# Patient Record
Sex: Female | Born: 1960
Health system: Southern US, Community
[De-identification: ages and names within clinical notes are randomized; demographics above are authoritative.]

## PROBLEM LIST (undated history)

## (undated) DIAGNOSIS — K219 Gastro-esophageal reflux disease without esophagitis: Secondary | ICD-10-CM

## (undated) DIAGNOSIS — Z923 Personal history of irradiation: Secondary | ICD-10-CM

## (undated) DIAGNOSIS — Z91018 Allergy to other foods: Secondary | ICD-10-CM

## (undated) DIAGNOSIS — T7840XA Allergy, unspecified, initial encounter: Secondary | ICD-10-CM

## (undated) DIAGNOSIS — M479 Spondylosis, unspecified: Secondary | ICD-10-CM

## (undated) DIAGNOSIS — Z9889 Other specified postprocedural states: Secondary | ICD-10-CM

## (undated) DIAGNOSIS — E785 Hyperlipidemia, unspecified: Secondary | ICD-10-CM

## (undated) DIAGNOSIS — K760 Fatty (change of) liver, not elsewhere classified: Secondary | ICD-10-CM

## (undated) DIAGNOSIS — C50919 Malignant neoplasm of unspecified site of unspecified female breast: Secondary | ICD-10-CM

## (undated) DIAGNOSIS — T8859XA Other complications of anesthesia, initial encounter: Secondary | ICD-10-CM

## (undated) DIAGNOSIS — D165 Benign neoplasm of lower jaw bone: Secondary | ICD-10-CM

## (undated) DIAGNOSIS — I2510 Atherosclerotic heart disease of native coronary artery without angina pectoris: Secondary | ICD-10-CM

## (undated) DIAGNOSIS — R112 Nausea with vomiting, unspecified: Secondary | ICD-10-CM

## (undated) DIAGNOSIS — I10 Essential (primary) hypertension: Secondary | ICD-10-CM

## (undated) HISTORY — DX: Fatty (change of) liver, not elsewhere classified: K76.0

## (undated) HISTORY — PX: ADENOIDECTOMY: SUR15

## (undated) HISTORY — PX: TYMPANOSTOMY TUBE PLACEMENT: SHX32

## (undated) HISTORY — DX: Hyperlipidemia, unspecified: E78.5

## (undated) HISTORY — DX: Atherosclerotic heart disease of native coronary artery without angina pectoris: I25.10

## (undated) HISTORY — PX: OTHER SURGICAL HISTORY: SHX169

## (undated) HISTORY — DX: Gastro-esophageal reflux disease without esophagitis: K21.9

## (undated) HISTORY — DX: Allergy to other foods: Z91.018

## (undated) HISTORY — DX: Malignant neoplasm of unspecified site of unspecified female breast: C50.919

## (undated) HISTORY — PX: TONSILLECTOMY: SUR1361

## (undated) HISTORY — DX: Essential (primary) hypertension: I10

## (undated) HISTORY — DX: Allergy, unspecified, initial encounter: T78.40XA

## (undated) HISTORY — DX: Personal history of irradiation: Z92.3

## (undated) HISTORY — DX: Spondylosis, unspecified: M47.9

## (undated) HISTORY — PX: COLONOSCOPY: SHX174

---

## 2017-09-29 DIAGNOSIS — I10 Essential (primary) hypertension: Secondary | ICD-10-CM | POA: Diagnosis not present

## 2017-09-29 DIAGNOSIS — Z6834 Body mass index (BMI) 34.0-34.9, adult: Secondary | ICD-10-CM | POA: Diagnosis not present

## 2017-09-29 DIAGNOSIS — R5381 Other malaise: Secondary | ICD-10-CM | POA: Diagnosis not present

## 2017-09-29 DIAGNOSIS — E785 Hyperlipidemia, unspecified: Secondary | ICD-10-CM | POA: Diagnosis not present

## 2018-01-12 DIAGNOSIS — Z01419 Encounter for gynecological examination (general) (routine) without abnormal findings: Secondary | ICD-10-CM | POA: Diagnosis not present

## 2018-01-18 DIAGNOSIS — H524 Presbyopia: Secondary | ICD-10-CM | POA: Diagnosis not present

## 2018-10-21 DIAGNOSIS — E785 Hyperlipidemia, unspecified: Secondary | ICD-10-CM | POA: Diagnosis not present

## 2018-10-21 DIAGNOSIS — I10 Essential (primary) hypertension: Secondary | ICD-10-CM | POA: Diagnosis not present

## 2018-10-21 DIAGNOSIS — L812 Freckles: Secondary | ICD-10-CM | POA: Diagnosis not present

## 2018-10-21 DIAGNOSIS — Z1331 Encounter for screening for depression: Secondary | ICD-10-CM | POA: Diagnosis not present

## 2018-10-21 DIAGNOSIS — E349 Endocrine disorder, unspecified: Secondary | ICD-10-CM | POA: Diagnosis not present

## 2018-11-02 ENCOUNTER — Other Ambulatory Visit: Payer: Self-pay | Admitting: Internal Medicine

## 2018-11-02 DIAGNOSIS — Z1231 Encounter for screening mammogram for malignant neoplasm of breast: Secondary | ICD-10-CM

## 2018-11-03 ENCOUNTER — Other Ambulatory Visit: Payer: Self-pay | Admitting: Internal Medicine

## 2018-11-03 ENCOUNTER — Other Ambulatory Visit: Payer: Self-pay | Admitting: Radiology

## 2018-11-03 DIAGNOSIS — E785 Hyperlipidemia, unspecified: Secondary | ICD-10-CM

## 2018-11-03 DIAGNOSIS — Z8249 Family history of ischemic heart disease and other diseases of the circulatory system: Secondary | ICD-10-CM

## 2018-11-08 DIAGNOSIS — E7849 Other hyperlipidemia: Secondary | ICD-10-CM | POA: Diagnosis not present

## 2018-11-08 DIAGNOSIS — I10 Essential (primary) hypertension: Secondary | ICD-10-CM | POA: Diagnosis not present

## 2018-11-15 ENCOUNTER — Other Ambulatory Visit: Payer: Self-pay

## 2018-11-22 ENCOUNTER — Other Ambulatory Visit: Payer: Self-pay

## 2018-12-13 ENCOUNTER — Ambulatory Visit
Admission: RE | Admit: 2018-12-13 | Discharge: 2018-12-13 | Disposition: A | Discharge status: Home / Self Care | Payer: Self-pay | Source: Ambulatory Visit | Attending: Internal Medicine | Admitting: Internal Medicine

## 2018-12-13 DIAGNOSIS — E785 Hyperlipidemia, unspecified: Secondary | ICD-10-CM

## 2018-12-13 DIAGNOSIS — Z8249 Family history of ischemic heart disease and other diseases of the circulatory system: Secondary | ICD-10-CM

## 2018-12-23 ENCOUNTER — Other Ambulatory Visit: Payer: Self-pay

## 2018-12-23 ENCOUNTER — Ambulatory Visit
Admission: RE | Admit: 2018-12-23 | Discharge: 2018-12-23 | Disposition: A | Discharge status: Home / Self Care | Payer: BC Managed Care – PPO | Source: Ambulatory Visit | Attending: Internal Medicine | Admitting: Internal Medicine

## 2018-12-23 DIAGNOSIS — Z1231 Encounter for screening mammogram for malignant neoplasm of breast: Secondary | ICD-10-CM

## 2019-01-18 DIAGNOSIS — Z01419 Encounter for gynecological examination (general) (routine) without abnormal findings: Secondary | ICD-10-CM | POA: Diagnosis not present

## 2019-03-29 DIAGNOSIS — U071 COVID-19: Secondary | ICD-10-CM

## 2019-03-29 HISTORY — DX: COVID-19: U07.1

## 2019-05-02 DIAGNOSIS — K219 Gastro-esophageal reflux disease without esophagitis: Secondary | ICD-10-CM | POA: Diagnosis not present

## 2019-05-02 DIAGNOSIS — U071 COVID-19: Secondary | ICD-10-CM | POA: Diagnosis not present

## 2019-05-02 DIAGNOSIS — I2584 Coronary atherosclerosis due to calcified coronary lesion: Secondary | ICD-10-CM | POA: Diagnosis not present

## 2019-05-02 DIAGNOSIS — E669 Obesity, unspecified: Secondary | ICD-10-CM | POA: Diagnosis not present

## 2019-05-02 DIAGNOSIS — Z1331 Encounter for screening for depression: Secondary | ICD-10-CM | POA: Diagnosis not present

## 2019-05-11 ENCOUNTER — Ambulatory Visit: Payer: BC Managed Care – PPO | Admitting: Allergy

## 2019-05-11 ENCOUNTER — Encounter: Payer: Self-pay | Admitting: Allergy

## 2019-05-11 ENCOUNTER — Other Ambulatory Visit: Payer: Self-pay

## 2019-05-11 VITALS — BP 122/78 | HR 91 | Temp 98.1°F | Resp 16 | Ht 62.0 in | Wt 198.0 lb

## 2019-05-11 DIAGNOSIS — IMO0001 Reserved for inherently not codable concepts without codable children: Secondary | ICD-10-CM

## 2019-05-11 DIAGNOSIS — T63481D Toxic effect of venom of other arthropod, accidental (unintentional), subsequent encounter: Secondary | ICD-10-CM

## 2019-05-11 DIAGNOSIS — T7800XA Anaphylactic reaction due to unspecified food, initial encounter: Secondary | ICD-10-CM | POA: Diagnosis not present

## 2019-05-11 NOTE — Patient Instructions (Addendum)
Reactions to shellfish  - skin testing to shellfish is very positive to shrimp and reactive to oyster and scallop.   - continue avoidance of all shellfish at this time  - have access to self-injectable epinephrine (Epipen or AuviQ) 0.11m at all times  - follow emergency action plan in case of allergic reaction  Follow-up 1 year with plans to obtain serum IgE levels to shellfish at that time

## 2019-05-11 NOTE — Progress Notes (Signed)
New Patient Note  RE: Donna Mcconnell MRN: [id number] DOB: 23-Aug-1960 Date of Office Visit: 05/11/2019  Referring provider: Shon Baton, MD Primary care provider: Shon Baton, MD  Chief Complaint: reaction to shrimp and mussels  History of present illness: Donna Mcconnell is a 59 y.o. female presenting today for consultation for food allergy.    She reports eating shrimp her "entire life". About 2 years ago reports getting steamed shrimp from [employer] and developed large hives.  Another episode in spring 2020 she went to the beach where she went to a seafood boil and she ate 2 shrimp on immediately started to feel itchy and developed hives on face and body.  She took benadryl with improvement.  She states having a 3 reaction to shrimp as well with hive development.  She also reports about 4-5 years ago she ate mussels at a [employer] and states hours later at home she had episode of emesis.   She denies having any respiratory or CV related symptoms without any food ingestion.   Currently does not have an epinephrine device.   She also reports when she has been stung likely by wasp she had large swelling that lasted about a week.  Otherwise no systemic symptoms with stings.  Denies history of asthma, eczema or environmental allergies.     She did have Covid over holidays and recently got her sense of taste and smell back.  She reports having myalgias mostly without respiratory symptoms.   Review of systems: Review of Systems  Constitutional: Negative.   HENT: Negative.   Eyes: Negative.   Respiratory: Negative.   Cardiovascular: Negative.   Gastrointestinal: Negative.   Musculoskeletal: Negative.   Skin: Negative.   Neurological: Negative.     All other systems negative unless noted above in HPI  Past medical history: Past Medical History:  Diagnosis Date  . Coronary atherosclerosis   . Fatty liver disease, nonalcoholic   . Food allergy   . GERD (gastroesophageal  reflux disease)   . Hyperlipidemia   . Hypertension   . Spondylosis     Past surgical history: Past Surgical History:  Procedure Laterality Date  . ADENOIDECTOMY    . TONSILLECTOMY    . TYMPANOSTOMY TUBE PLACEMENT      Family history:  Family History  Problem Relation Age of Onset  . Heart attack Mother 16       deceased  . Cancer - Lung Father 40       lung cancer    Social history: Lives in a home without carpeting with electric heating and central cooling.  No concern for water damage, mildew or roaches in the home.  She is a Probation officer.  No smoking history.    Medication List: Current Outpatient Medications  Medication Sig Dispense Refill  . acyclovir ointment (ZOVIRAX) 5 % APPLY TO AFFECTED AREA AS DIRECTED    . estradiol (ESTRACE) 1 MG tablet Take 1 mg by mouth daily.    Marland Kitchen estradiol-norethindrone (ACTIVELLA) 1-0.5 MG tablet Take 1 tablet by mouth daily.    . pantoprazole (PROTONIX) 40 MG tablet Take 40 mg by mouth daily.    . valACYclovir (VALTREX) 500 MG tablet Take 500 mg by mouth daily.    . valsartan (DIOVAN) 80 MG tablet Take 80 mg by mouth daily.     No current facility-administered medications for this visit.    Known medication allergies: No Known Allergies   Physical examination: Blood pressure 122/78, pulse 91,  temperature 98.1 F (36.7 C), temperature source Temporal, resp. rate 16, height 5' 2"  (1.575 m), weight 198 lb (89.8 kg), SpO2 98 %.  General: Alert, interactive, in no acute distress. HEENT: PERRLA, TMs pearly gray, turbinates non-edematous without discharge, post-pharynx non erythematous. Neck: Supple without lymphadenopathy. Lungs: Clear to auscultation without wheezing, rhonchi or rales. {no increased work of breathing. CV: Normal S1, S2 without murmurs. Abdomen: Nondistended, nontender. Skin: Warm and dry, without lesions or rashes. Extremities:  No clubbing, cyanosis or edema. Neuro:   Grossly  intact.  Diagnositics/Labs:  Allergy testing: shellfish skin prick testing is positive to shrimp, scallop and oyster.  Histamine control is positive.  Allergy testing results were read and interpreted by provider, documented by clinical staff.   Assessment and plan: Anaphylaxis due to food  - skin testing to shellfish is very positive to shrimp and reactive to oyster and scallop.   - continue avoidance of all shellfish at this time  - have access to self-injectable epinephrine (Epipen or AuviQ) 0.22m at all times  - follow emergency action plan in case of allergic reaction  Hymenoptera sting - Her symptoms of swelling at sting site that last about a week with stinging insect sting is still within normal realm of response to a sting.  She does not warrant any testing or immunotherapy for venoms at this time.  Discussed if she has future stings with development of systemic symptoms to [place] as she would warrant testing at that time.  She now will have access to epinephrine as above.    Follow-up 1 year with plans to obtain serum IgE levels to shellfish at that time  I appreciate the opportunity to [facility]. Please do not hesitate to contact me with questions.  Sincerely,   SPrudy Feeler MD Allergy/Immunology [facility] [place]

## 2019-05-23 DIAGNOSIS — N950 Postmenopausal bleeding: Secondary | ICD-10-CM | POA: Diagnosis not present

## 2019-05-23 DIAGNOSIS — D259 Leiomyoma of uterus, unspecified: Secondary | ICD-10-CM | POA: Diagnosis not present

## 2019-05-26 ENCOUNTER — Other Ambulatory Visit: Payer: Self-pay | Admitting: *Deleted

## 2019-05-26 ENCOUNTER — Telehealth: Payer: Self-pay | Admitting: Allergy

## 2019-05-26 MED ORDER — EPINEPHRINE 0.3 MG/0.3ML IJ SOAJ: 0.3000 mg | Freq: Once | INTRAMUSCULAR | 1 refills | Status: DC

## 2019-05-26 NOTE — Telephone Encounter (Signed)
Prescription for Donna Mcconnell was never sent in. Prescription has now been sent in to [facility]. Called patient and informed that the prescription was now sent in and she should be getting a call shortly. Patient verbalized understanding.

## 2019-05-26 NOTE — Telephone Encounter (Signed)
Pt called and said that she was suppose to receive a call from [place] about the Auvi-Q but has not heard anything. [phone].

## 2019-05-30 DIAGNOSIS — Z20828 Contact with and (suspected) exposure to other viral communicable diseases: Secondary | ICD-10-CM | POA: Diagnosis not present

## 2019-05-30 DIAGNOSIS — E7849 Other hyperlipidemia: Secondary | ICD-10-CM | POA: Diagnosis not present

## 2019-07-25 ENCOUNTER — Encounter: Payer: Self-pay | Admitting: Internal Medicine

## 2019-08-26 ENCOUNTER — Ambulatory Visit (AMBULATORY_SURGERY_CENTER): Payer: Self-pay

## 2019-08-26 ENCOUNTER — Other Ambulatory Visit: Payer: Self-pay

## 2019-08-26 VITALS — Temp 97.1°F | Ht 62.0 in | Wt 199.6 lb

## 2019-08-26 DIAGNOSIS — Z1211 Encounter for screening for malignant neoplasm of colon: Secondary | ICD-10-CM

## 2019-08-26 DIAGNOSIS — Z01818 Encounter for other preprocedural examination: Secondary | ICD-10-CM

## 2019-08-26 MED ORDER — SUTAB 1479-225-188 MG PO TABS: 12.0000 | ORAL_TABLET | ORAL | 0 refills | Status: DC

## 2019-08-26 NOTE — Progress Notes (Signed)
No allergies to soy or egg Pt is not on blood thinners or diet pills Denies issues with sedation/intubation Denies atrial flutter/fib Denies constipation   Emmi instructions given to pt  Pt is aware of Covid safety and care partner requirements.

## 2019-09-07 ENCOUNTER — Ambulatory Visit (INDEPENDENT_AMBULATORY_CARE_PROVIDER_SITE_OTHER): Payer: BC Managed Care – PPO

## 2019-09-07 ENCOUNTER — Other Ambulatory Visit: Payer: Self-pay | Admitting: Internal Medicine

## 2019-09-07 DIAGNOSIS — Z1159 Encounter for screening for other viral diseases: Secondary | ICD-10-CM | POA: Diagnosis not present

## 2019-09-07 LAB — SARS CORONAVIRUS 2 (TAT 6-24 HRS): SARS Coronavirus 2: NEGATIVE

## 2019-09-09 ENCOUNTER — Encounter: Payer: Self-pay | Admitting: Internal Medicine

## 2019-09-09 ENCOUNTER — Other Ambulatory Visit: Payer: Self-pay

## 2019-09-09 ENCOUNTER — Ambulatory Visit (AMBULATORY_SURGERY_CENTER): Payer: BC Managed Care – PPO | Admitting: Internal Medicine

## 2019-09-09 VITALS — BP 109/68 | HR 66 | Temp 96.0°F | Resp 17 | Ht 67.0 in | Wt 199.0 lb

## 2019-09-09 DIAGNOSIS — Z1211 Encounter for screening for malignant neoplasm of colon: Secondary | ICD-10-CM | POA: Diagnosis not present

## 2019-09-09 MED ORDER — SODIUM CHLORIDE 0.9 % IV SOLN: 500.0000 mL | INTRAVENOUS | Status: DC

## 2019-09-09 NOTE — Progress Notes (Signed)
VSS Report to PACU RN

## 2019-09-09 NOTE — Patient Instructions (Signed)
Handouts provided on diverticulosis and hemorrhoids.   YOU HAD AN ENDOSCOPIC PROCEDURE TODAY AT [facility]:   Refer to the procedure report that was given to you for any specific questions about what was found during the examination.  If the procedure report does not answer your questions, please call your gastroenterologist to clarify.  If you requested that your care partner not be given the details of your procedure findings, then the procedure report has been included in a sealed envelope for you to review at your convenience later.  YOU SHOULD EXPECT: Some feelings of bloating in the abdomen. Passage of more gas than usual.  Walking can help get rid of the air that was put into your GI tract during the procedure and reduce the bloating. If you had a lower endoscopy (such as a colonoscopy or flexible sigmoidoscopy) you may notice spotting of blood in your stool or on the toilet paper. If you underwent a bowel prep for your procedure, you may not have a normal bowel movement for a few days.  Please Note:  You might notice some irritation and congestion in your nose or some drainage.  This is from the oxygen used during your procedure.  There is no need for concern and it should clear up in a day or so.  SYMPTOMS TO REPORT IMMEDIATELY:   Following lower endoscopy (colonoscopy or flexible sigmoidoscopy):  Excessive amounts of blood in the stool  Significant tenderness or worsening of abdominal pains  Swelling of the abdomen that is new, acute  Fever of 100F or higher   For urgent or emergent issues, a gastroenterologist can be reached at any hour by calling [phone]. Do not use MyChart messaging for urgent concerns.    DIET:  We do recommend a small meal at first, but then you may proceed to your regular diet.  Drink plenty of fluids but you should avoid alcoholic beverages for 24 hours.  ACTIVITY:  You should plan to take it easy for the rest of today and you should  NOT DRIVE or use heavy machinery until tomorrow (because of the sedation medicines used during the test).    FOLLOW UP: Our staff will call the number listed on your records 48-72 hours following your procedure to check on you and address any questions or concerns that you may have regarding the information given to you following your procedure. If we do not reach you, we will leave a message.  We will attempt to reach you two times.  During this call, we will ask if you have developed any symptoms of COVID 19. If you develop any symptoms (ie: fever, flu-like symptoms, shortness of breath, cough etc.) before then, please call [phone].  If you test positive for Covid 19 in the 2 weeks post procedure, please call and report this information to [place].    If any biopsies were taken you will be contacted by phone or by letter within the next 1-3 weeks.  Please call us at [phone] if you have not heard about the biopsies in 3 weeks.    SIGNATURES/CONFIDENTIALITY: You and/or your care partner have signed paperwork which will be entered into your electronic medical record.  These signatures attest to the fact that that the information above on your After Visit Summary has been reviewed and is understood.  Full responsibility of the confidentiality of this discharge information lies with you and/or your care-partner.

## 2019-09-09 NOTE — Progress Notes (Signed)
Temp JB  KA v/s  I have reviewed the patient's medical history in detail and updated the computerized patient record.

## 2019-09-13 ENCOUNTER — Telehealth: Payer: Self-pay | Admitting: *Deleted

## 2019-09-13 NOTE — Telephone Encounter (Signed)
  Follow up Call-  Call back number 09/09/2019  Post procedure Call Back phone  # [phone]  Permission to leave phone message Yes     Patient questions:  Do you have a fever, pain , or abdominal swelling? No. Pain Score  0 *  Have you tolerated food without any problems? Yes.    Have you been able to return to your normal activities? Yes.    Do you have any questions about your discharge instructions: Diet   No. Medications  No. Follow up visit  No.  Do you have questions or concerns about your Care? No.  Actions: * If pain score is 4 or above: No action needed, pain <4.  1. Have you developed a fever since your procedure? No   2.   Have you had an respiratory symptoms (SOB or cough) since your procedure? no  3.   Have you tested positive for COVID 19 since your procedure no  4.   Have you had any family members/close contacts diagnosed with the COVID 19 since your procedure?  no   If yes to any of these questions please route to Joylene John, RN and Erenest Rasher, RN

## 2019-09-13 NOTE — Telephone Encounter (Signed)
  Follow up Call-  Call back number 09/09/2019  Post procedure Call Back phone  # [phone]  Permission to leave phone message Yes    [facility]

## 2019-12-06 DIAGNOSIS — E785 Hyperlipidemia, unspecified: Secondary | ICD-10-CM | POA: Diagnosis not present

## 2019-12-06 DIAGNOSIS — D649 Anemia, unspecified: Secondary | ICD-10-CM | POA: Diagnosis not present

## 2019-12-13 ENCOUNTER — Other Ambulatory Visit: Payer: Self-pay | Admitting: Internal Medicine

## 2019-12-13 DIAGNOSIS — Z1231 Encounter for screening mammogram for malignant neoplasm of breast: Secondary | ICD-10-CM

## 2019-12-27 ENCOUNTER — Ambulatory Visit
Admission: RE | Admit: 2019-12-27 | Discharge: 2019-12-27 | Disposition: A | Discharge status: Home / Self Care | Payer: BC Managed Care – PPO | Source: Ambulatory Visit | Attending: Internal Medicine | Admitting: Internal Medicine

## 2019-12-27 ENCOUNTER — Other Ambulatory Visit: Payer: Self-pay | Admitting: Obstetrics and Gynecology

## 2019-12-27 ENCOUNTER — Other Ambulatory Visit: Payer: Self-pay

## 2019-12-27 DIAGNOSIS — Z1231 Encounter for screening mammogram for malignant neoplasm of breast: Secondary | ICD-10-CM

## 2020-01-10 DIAGNOSIS — D509 Iron deficiency anemia, unspecified: Secondary | ICD-10-CM | POA: Diagnosis not present

## 2020-01-10 DIAGNOSIS — K219 Gastro-esophageal reflux disease without esophagitis: Secondary | ICD-10-CM | POA: Diagnosis not present

## 2020-01-19 DIAGNOSIS — Z01419 Encounter for gynecological examination (general) (routine) without abnormal findings: Secondary | ICD-10-CM | POA: Diagnosis not present

## 2020-04-26 DIAGNOSIS — E785 Hyperlipidemia, unspecified: Secondary | ICD-10-CM | POA: Diagnosis not present

## 2020-04-26 DIAGNOSIS — Z0000 Encounter for general adult medical examination without abnormal findings: Secondary | ICD-10-CM | POA: Diagnosis not present

## 2020-05-03 DIAGNOSIS — I10 Essential (primary) hypertension: Secondary | ICD-10-CM | POA: Diagnosis not present

## 2020-05-03 DIAGNOSIS — R82998 Other abnormal findings in urine: Secondary | ICD-10-CM | POA: Diagnosis not present

## 2020-05-03 DIAGNOSIS — Z0000 Encounter for general adult medical examination without abnormal findings: Secondary | ICD-10-CM | POA: Diagnosis not present

## 2020-05-03 DIAGNOSIS — Z1331 Encounter for screening for depression: Secondary | ICD-10-CM | POA: Diagnosis not present

## 2020-05-10 ENCOUNTER — Encounter: Payer: Self-pay | Admitting: Allergy

## 2020-05-10 ENCOUNTER — Ambulatory Visit: Payer: BC Managed Care – PPO | Admitting: Allergy

## 2020-05-10 ENCOUNTER — Other Ambulatory Visit: Payer: Self-pay

## 2020-05-10 VITALS — BP 130/88 | HR 70 | Temp 98.1°F | Resp 16 | Ht 62.0 in | Wt 201.4 lb

## 2020-05-10 DIAGNOSIS — T7800XD Anaphylactic reaction due to unspecified food, subsequent encounter: Secondary | ICD-10-CM | POA: Diagnosis not present

## 2020-05-10 DIAGNOSIS — T7800XA Anaphylactic reaction due to unspecified food, initial encounter: Secondary | ICD-10-CM

## 2020-05-10 MED ORDER — EPINEPHRINE 0.3 MG/0.3ML IJ SOAJ: 0.3000 mg | Freq: Once | INTRAMUSCULAR | 2 refills | Status: DC

## 2020-05-10 NOTE — Patient Instructions (Signed)
Anaphylaxis due to food  - skin testing from 05/18/2019 was very positive to shrimp and reactive to oyster and scallop.   - continue avoidance of all shellfish at this time  - will obtain shellfish IgE panel  - have access to self-injectable epinephrine (Epipen or AuviQ) 0.75m at all times  - follow emergency action plan in case of allergic reaction  Follow-up: 1 year or sooner if needed

## 2020-05-10 NOTE — Progress Notes (Signed)
    Follow-up Note  RE: Donna Mcconnell MRN: [id number] DOB: [birth year] Date of Office Visit: 05/10/2020   History of present illness: Donna Mcconnell is a 60 y.o. female presenting today for follow-up of food allergy.  She was last seen in the office on [address].  She has not had any health changes, surgeries or hospitalizations in the past year.  She states she has been avoiding shellfish without any Afstyla does not need to use her epinephrine device.  She is interested in seeing where she stands in regards to this shellfish allergy and would like further testing done.  She has not had any further sting's since last visit.  Review of systems: Review of Systems  Constitutional: Negative.   HENT: Negative.   Eyes: Negative.   Respiratory: Negative.   Cardiovascular: Negative.   Gastrointestinal: Negative.   Musculoskeletal: Negative.   Skin: Negative.   Neurological: Negative.     All other systems negative unless noted above in HPI  Past medical/social/surgical/family history have been reviewed and are unchanged unless specifically indicated below.  No changes  Medication List: Current Outpatient Medications  Medication Sig Dispense Refill  . acyclovir ointment (ZOVIRAX) 5 % APPLY TO AFFECTED AREA AS DIRECTED    . EPINEPHrine (AUVI-Q) 0.3 mg/0.3 mL IJ SOAJ injection Inject 0.3 mg into the muscle once for 1 dose. As directed for life-threatening allergic reactions 2 each 2  . estradiol (ESTRACE) 1 MG tablet Take 1 mg by mouth daily.    Marland Kitchen estradiol-norethindrone (ACTIVELLA) 1-0.5 MG tablet Take 1 tablet by mouth daily.    . pantoprazole (PROTONIX) 40 MG tablet Take 40 mg by mouth daily.    . valsartan (DIOVAN) 80 MG tablet Take 80 mg by mouth daily.    . Sodium Sulfate-Mag Sulfate-KCl (SUTAB) [phone] MG TABS Take 12 tablets by mouth as directed. (Patient not taking: Reported on 05/10/2020) 24 tablet 0  . valACYclovir (VALTREX) 500 MG tablet Take 500 mg by mouth daily.  (Patient not taking: Reported on 05/10/2020)     No current facility-administered medications for this visit.     Known medication allergies: Allergies  Allergen Reactions  . Shellfish Allergy     Mussels     Physical examination: Blood pressure 130/88, pulse 70, temperature 98.1 F (36.7 C), resp. rate 16, height 5' 2"  (1.575 m), weight 201 lb 6.4 oz (91.4 kg), SpO2 97 %.  General: Alert, interactive, in no acute distress. HEENT: TMs pearly gray, turbinates non-edematous without discharge, post-pharynx non erythematous. Neck: Supple without lymphadenopathy. Lungs: Clear to auscultation without wheezing, rhonchi or rales. {no increased work of breathing. CV: Normal S1, S2 without murmurs. Abdomen: Nondistended, nontender. Skin: Warm and dry, without lesions or rashes. Extremities:  No clubbing, cyanosis or edema. Neuro:   Grossly intact.  Diagnositics/Labs: None today  Assessment and plan:   Anaphylaxis due to food  - skin testing from 05/18/2019 was very positive to shrimp and reactive to oyster and scallop.   - continue avoidance of all shellfish at this time  - will obtain shellfish IgE panel  - have access to self-injectable epinephrine (Epipen or AuviQ) 0.12m at all times  - follow emergency action plan in case of allergic reaction  Follow-up: 1 year or sooner if needed  I appreciate the opportunity to take part in [facility]. Please do not hesitate to contact me with questions.  Sincerely,   SPrudy Feeler MD Allergy/Immunology Allergy and [facility] [place]

## 2020-05-14 LAB — ALLERGEN PROFILE, SHELLFISH
Clam IgE: <0.1 kU/L
F023-IgE Crab: <0.1 kU/L
F080-IgE Lobster: <0.1 kU/L
F290-IgE Oyster: <0.1 kU/L
Scallop IgE: <0.1 kU/L
Shrimp IgE: <0.1 kU/L

## 2020-06-14 DIAGNOSIS — D165 Benign neoplasm of lower jaw bone: Secondary | ICD-10-CM | POA: Diagnosis not present

## 2020-06-22 DIAGNOSIS — R300 Dysuria: Secondary | ICD-10-CM | POA: Diagnosis not present

## 2020-06-25 DIAGNOSIS — M542 Cervicalgia: Secondary | ICD-10-CM | POA: Diagnosis not present

## 2020-06-26 DIAGNOSIS — M2740 Unspecified cyst of jaw: Secondary | ICD-10-CM | POA: Diagnosis not present

## 2020-06-27 DIAGNOSIS — D165 Benign neoplasm of lower jaw bone: Secondary | ICD-10-CM | POA: Diagnosis not present

## 2020-07-18 ENCOUNTER — Encounter: Payer: Self-pay | Admitting: Allergy

## 2020-07-19 DIAGNOSIS — M799 Soft tissue disorder, unspecified: Secondary | ICD-10-CM | POA: Diagnosis not present

## 2020-07-23 DIAGNOSIS — M542 Cervicalgia: Secondary | ICD-10-CM | POA: Diagnosis not present

## 2020-07-26 ENCOUNTER — Encounter: Payer: Self-pay | Admitting: Allergy

## 2020-07-26 ENCOUNTER — Ambulatory Visit: Payer: BC Managed Care – PPO | Admitting: Allergy

## 2020-07-26 ENCOUNTER — Other Ambulatory Visit: Payer: Self-pay

## 2020-07-26 VITALS — BP 130/80 | HR 74 | Temp 97.6°F | Resp 18 | Ht 62.0 in | Wt 204.0 lb

## 2020-07-26 DIAGNOSIS — T7800XA Anaphylactic reaction due to unspecified food, initial encounter: Secondary | ICD-10-CM

## 2020-07-26 DIAGNOSIS — T7800XD Anaphylactic reaction due to unspecified food, subsequent encounter: Secondary | ICD-10-CM

## 2020-07-26 NOTE — Patient Instructions (Addendum)
Anaphylaxis due to food  - skin testing to shrimp today is negative.  Serum IgE testing to shrimp was negative from January 2022  - unfortunately developed facial hives and thus this is not a successful challenge.  - still with shrimp/shellfish allergy  - continue avoidance of all shellfish   - have access to self-injectable epinephrine (Epipen or AuviQ) 0.62m at all times  - follow emergency action plan in case of allergic reaction  Follow-up: 1 year or sooner if needed

## 2020-07-26 NOTE — Progress Notes (Signed)
Follow-up Note  RE: Donna UNCAPHER MRN: [id number] DOB: 1960/10/03 Date of Office Visit: 07/26/2020   History of present illness: Donna Mcconnell is a 60 y.o. female presenting today for food challenge to shrimp.  She was last seen in the office on 05/10/2020 for food allergy.  She is feeling well today.  She does note that she has had some throat clearing postnasal drainage.  She also notes on her right forearm itchy area that she believes may be ringworm.  She states she did handle the shrimp last night to prepare for today and did not have any issues.  She has held all antihistamines for food challenge today.  Skin testing from January 2021 was 2+.  Shellfish IgE panel with shrimp was negative from May 10, 2020.  Regional history with shrimp about 3 years ago now she recalls eating sting shrimp and developing large hives.  There was another episode in 2020 where she noted immediate itchiness and development of hives on face and body after she ate several shrimp.  Review of systems: Review of Systems  Constitutional: Negative.   HENT:       See HPI  Eyes: Negative.   Respiratory: Negative.   Cardiovascular: Negative.   Gastrointestinal: Negative.   Musculoskeletal: Negative.   Skin:       See HPI  Neurological: Negative.     All other systems negative unless noted above in HPI  Past medical/social/surgical/family history have been reviewed and are unchanged unless specifically indicated below.  No changes  Medication List: Current Outpatient Medications  Medication Sig Dispense Refill  . acyclovir ointment (ZOVIRAX) 5 % APPLY TO AFFECTED AREA AS DIRECTED    . estradiol (ESTRACE) 1 MG tablet Take 1 mg by mouth daily.    Marland Kitchen estradiol-norethindrone (ACTIVELLA) 1-0.5 MG tablet Take 1 tablet by mouth daily.    . pantoprazole (PROTONIX) 40 MG tablet Take 40 mg by mouth daily.    . Sodium Sulfate-Mag Sulfate-KCl (SUTAB) [phone] MG TABS Take 12 tablets by mouth as  directed. 24 tablet 0  . valACYclovir (VALTREX) 500 MG tablet Take 500 mg by mouth daily.    . valsartan (DIOVAN) 80 MG tablet Take 80 mg by mouth daily.     No current facility-administered medications for this visit.     Known medication allergies: Allergies  Allergen Reactions  . Other Itching  . Shellfish Allergy     Mussels     Physical examination: Blood pressure 130/80, pulse 74, temperature 97.6 F (36.4 C), resp. rate 18, height 5' 2"  (1.575 m), weight 204 lb (92.5 kg), SpO2 98 %.  General: Alert, interactive, in no acute distress. HEENT: PERRLA, TMs pearly gray, turbinates non-edematous without discharge, post-pharynx non erythematous. Neck: Supple without lymphadenopathy. Lungs: Clear to auscultation without wheezing, rhonchi or rales. {no increased work of breathing. CV: Normal S1, S2 without murmurs. Abdomen: Nondistended, nontender. Skin: Right forearm with a dime sized area of erythema with raised border and slight central scale. Extremities:  No clubbing, cyanosis or edema. Neuro:   Grossly intact.  Diagnositics/Labs:  Allergy testing: Skin testing shrimp is negative with a positive histamine control. Allergy testing results were read and interpreted by provider, documented by clinical staff.  Food challenge to shrimp with use of shrimp. Benefits and risks of challenge discussed and consent obtained. She was provided with increasing doses of shrimp every 10-15 minutes and consumed total of about 1.5 tsp of shrimp (~3/4 shrimp) and after 2 ingestion doses  she developed facial itching and facial hives.  Challenge stopped.  Facial hives very visible.  Exam unchanged from prior to challenge as above.  Vitals were obtained and remained stable.  treated with zyrtec 25m.  After about 20-30 minutes she noted improvement with reduction of itching.  Se was observed for additional 30 minutes and did have resolution of facial itch and hives.    Assessment and plan:    Anaphylaxis due to food  - skin testing to shrimp today is negative.  Serum IgE testing to shrimp was negative from January 2022  - unfortunately developed facial hives and thus this is not a successful challenge.  - still with shrimp/shellfish allergy  - continue avoidance of all shellfish   - have access to self-injectable epinephrine (Epipen or AuviQ) 0.378mat all times  - follow emergency action plan in case of allergic reaction  Follow-up: 1 year or sooner if needed  I appreciate the opportunity to take part in [place]. Please do not hesitate to contact me with questions.  Sincerely,   ShPrudy FeelerMD Allergy/Immunology Allergy and AsBo[facility]ville[facility] [place]

## 2020-07-27 DIAGNOSIS — D165 Benign neoplasm of lower jaw bone: Secondary | ICD-10-CM | POA: Diagnosis not present

## 2020-10-16 DIAGNOSIS — D165 Benign neoplasm of lower jaw bone: Secondary | ICD-10-CM | POA: Diagnosis not present

## 2020-11-01 DIAGNOSIS — D165 Benign neoplasm of lower jaw bone: Secondary | ICD-10-CM | POA: Diagnosis not present

## 2020-11-30 DIAGNOSIS — M272 Inflammatory conditions of jaws: Secondary | ICD-10-CM | POA: Diagnosis not present

## 2021-01-14 ENCOUNTER — Other Ambulatory Visit: Payer: Self-pay

## 2021-01-14 ENCOUNTER — Ambulatory Visit
Admission: RE | Admit: 2021-01-14 | Discharge: 2021-01-14 | Disposition: A | Discharge status: Home / Self Care | Payer: BC Managed Care – PPO | Source: Ambulatory Visit | Attending: Internal Medicine | Admitting: Internal Medicine

## 2021-01-14 ENCOUNTER — Other Ambulatory Visit: Payer: Self-pay | Admitting: Internal Medicine

## 2021-01-14 DIAGNOSIS — Z1231 Encounter for screening mammogram for malignant neoplasm of breast: Secondary | ICD-10-CM | POA: Diagnosis not present

## 2021-01-21 ENCOUNTER — Other Ambulatory Visit: Payer: Self-pay | Admitting: Internal Medicine

## 2021-01-21 DIAGNOSIS — R928 Other abnormal and inconclusive findings on diagnostic imaging of breast: Secondary | ICD-10-CM

## 2021-02-02 ENCOUNTER — Other Ambulatory Visit: Payer: Self-pay

## 2021-02-02 ENCOUNTER — Other Ambulatory Visit: Payer: Self-pay | Admitting: Internal Medicine

## 2021-02-02 ENCOUNTER — Ambulatory Visit
Admission: RE | Admit: 2021-02-02 | Discharge: 2021-02-02 | Disposition: A | Discharge status: Home / Self Care | Payer: BC Managed Care – PPO | Source: Ambulatory Visit | Attending: Internal Medicine | Admitting: Internal Medicine

## 2021-02-02 DIAGNOSIS — R928 Other abnormal and inconclusive findings on diagnostic imaging of breast: Secondary | ICD-10-CM | POA: Diagnosis not present

## 2021-02-02 DIAGNOSIS — R921 Mammographic calcification found on diagnostic imaging of breast: Secondary | ICD-10-CM

## 2021-02-02 DIAGNOSIS — N6310 Unspecified lump in the right breast, unspecified quadrant: Secondary | ICD-10-CM

## 2021-02-02 DIAGNOSIS — R922 Inconclusive mammogram: Secondary | ICD-10-CM | POA: Diagnosis not present

## 2021-02-05 ENCOUNTER — Other Ambulatory Visit: Payer: BC Managed Care – PPO

## 2021-02-08 ENCOUNTER — Ambulatory Visit
Admission: RE | Admit: 2021-02-08 | Discharge: 2021-02-08 | Disposition: A | Discharge status: Home / Self Care | Payer: BC Managed Care – PPO | Source: Ambulatory Visit | Attending: Internal Medicine | Admitting: Internal Medicine

## 2021-02-08 ENCOUNTER — Other Ambulatory Visit: Payer: Self-pay

## 2021-02-08 DIAGNOSIS — C50811 Malignant neoplasm of overlapping sites of right female breast: Secondary | ICD-10-CM | POA: Diagnosis not present

## 2021-02-08 DIAGNOSIS — R921 Mammographic calcification found on diagnostic imaging of breast: Secondary | ICD-10-CM

## 2021-02-08 DIAGNOSIS — D0581 Other specified type of carcinoma in situ of right breast: Secondary | ICD-10-CM | POA: Diagnosis not present

## 2021-02-08 DIAGNOSIS — N6315 Unspecified lump in the right breast, overlapping quadrants: Secondary | ICD-10-CM | POA: Diagnosis not present

## 2021-02-08 DIAGNOSIS — Z170 Estrogen receptor positive status [ER+]: Secondary | ICD-10-CM | POA: Diagnosis not present

## 2021-02-08 DIAGNOSIS — N6091 Unspecified benign mammary dysplasia of right breast: Secondary | ICD-10-CM | POA: Diagnosis not present

## 2021-02-08 DIAGNOSIS — N6310 Unspecified lump in the right breast, unspecified quadrant: Secondary | ICD-10-CM

## 2021-02-12 ENCOUNTER — Telehealth: Payer: Self-pay | Admitting: Hematology

## 2021-02-12 NOTE — Telephone Encounter (Signed)
Spoke to patient to confirm afternoon clinic appointment for 10/26, packet emailed to patient

## 2021-02-14 DIAGNOSIS — M272 Inflammatory conditions of jaws: Secondary | ICD-10-CM | POA: Diagnosis not present

## 2021-02-15 ENCOUNTER — Encounter: Payer: Self-pay | Admitting: *Deleted

## 2021-02-15 DIAGNOSIS — Z170 Estrogen receptor positive status [ER+]: Secondary | ICD-10-CM | POA: Insufficient documentation

## 2021-02-15 DIAGNOSIS — D0511 Intraductal carcinoma in situ of right breast: Secondary | ICD-10-CM | POA: Diagnosis not present

## 2021-02-18 DIAGNOSIS — Z472 Encounter for removal of internal fixation device: Secondary | ICD-10-CM | POA: Diagnosis not present

## 2021-02-18 DIAGNOSIS — D165 Benign neoplasm of lower jaw bone: Secondary | ICD-10-CM | POA: Diagnosis not present

## 2021-02-19 NOTE — Progress Notes (Signed)
Radiation Oncology         [phone]) [phone] ________________________________  Multidisciplinary Breast Oncology Clinic [facility]) Initial Outpatient Consultation  Name: Donna Mcconnell MRN: [id number]  Date: 02/20/2021  DOB: 19-Jan-1961  OI:ZTIWP, [name], MD  Rolm Bookbinder, MD   REFERRING PHYSICIAN: Rolm Bookbinder, MD  DIAGNOSIS: The encounter diagnosis was Malignant neoplasm of upper-outer quadrant of right breast in female, estrogen receptor positive ([place]).  Stage IA (cT1b, cN0, cM0) Right Breast UOQ, Invasive and in-situ ductal carcinoma, ER+ / PR+ / Her2-, Grade 1-2    ICD-10-CM   1. Malignant neoplasm of upper-outer quadrant of right breast in female, estrogen receptor positive ([place])  C50.411    Z17.0       HISTORY OF PRESENT ILLNESS::Donna Mcconnell is a 60 y.o. female who is presenting to the office today for evaluation of her newly diagnosed breast cancer. She is accompanied by her husband. She is doing well overall.   She had routine screening mammography on 01/14/21 showing a possible abnormality in the right breast. She underwent bilateral diagnostic mammography with tomography and right breast ultrasonography at [facility] on 02/02/21 showing: a suspicious 0.8 cm mass in the right breast at the 12 o'clock position, with suspicious calcifications adjacent to the right breast mass spanning 1.1 cm.  Right breast biopsy at the 12 o'clock position on 02/08/21 showed: grade 1-2 invasive ductal carcinoma measuring 0.6 cm in the greatest linear extent, and ductal carcinoma in-situ and calcifications. Upper inner quadrant biopsy revealed flat epithelial atypia with calcifications and lobular neoplasia. Prognostic indicators significant for: estrogen receptor, >95% positive and progesterone receptor, >95% positive, both with strong staining intensity. Proliferation marker Ki67 at 1%. HER2 negative.  Menarche: 59 years old Age at first live birth: 60 years old GP: 2 LMP: no  longer having periods Contraceptive: none HRT: Yes, for 10 years stopping on 02/11/21. Has been having a lot of side effects from discontinuing hormones.  The patient was referred today for presentation in the multidisciplinary conference.  Radiology studies and pathology slides were presented there for review and discussion of treatment options.  A consensus was discussed regarding potential next steps.  PREVIOUS RADIATION THERAPY: No  PAST MEDICAL HISTORY:  Past Medical History:  Diagnosis Date   Allergy    shellfish   Breast cancer ([place])    Coronary atherosclerosis    Fatty liver disease, nonalcoholic    Food allergy    GERD (gastroesophageal reflux disease)    Hyperlipidemia    Hypertension    Spondylosis     PAST SURGICAL HISTORY:no previous breast biopsies  Past Surgical History:  Procedure Laterality Date   ADENOIDECTOMY     cercalage     COLONOSCOPY     10 yrs ago   TONSILLECTOMY     TYMPANOSTOMY TUBE PLACEMENT      FAMILY HISTORY: great grandmother with breast cancer, grandfather with metastatic prostate cancer Family History  Problem Relation Age of Onset   Heart attack Mother 58       deceased   Early death Mother    Heart disease Mother    Obesity Mother    Cancer Father 34       unknown primary; mets   Obesity Father    Varicose Veins Father    Obesity Maternal Grandmother    Prostate cancer Maternal Grandfather 53       metastatic   Arthritis Maternal Grandfather    Obesity Paternal Grandmother    Stroke Paternal Grandmother  Varicose Veins Paternal Grandmother    Arthritis Paternal Grandfather    Cancer Paternal Grandfather 55       unknown type; ? lung   Breast cancer Other        MGF's mother; dx unknown age   Colon cancer Neg Hx    Colon polyps Neg Hx    Esophageal cancer Neg Hx    Stomach cancer Neg Hx    Rectal cancer Neg Hx     SOCIAL HISTORY:  Social History   Socioeconomic History   Marital status: Married    Spouse name:  Not on file   Number of children: 2   Years of education: Not on file   Highest education level: Not on file  Occupational History   Not on file  Tobacco Use   Smoking status: Never   Smokeless tobacco: Never   Tobacco comments:    smoked in college  Vaping Use   Vaping Use: Never used  Substance and Sexual Activity   Alcohol use: Not Currently    Alcohol/week: 5.0 standard drinks    Types: 5 Glasses of wine per week    Comment: I have not had much wine since July.   Drug use: Never   Sexual activity: Yes    Birth control/protection: Post-menopausal  Other Topics Concern   Not on file  Social History Narrative   Not on file   Social Determinants of Health   Financial Resource Strain: Not on file  Food Insecurity: Not on file  Transportation Needs: Not on file  Physical Activity: Not on file  Stress: Not on file  Social Connections: Not on file    ALLERGIES:  Allergies  Allergen Reactions   Other Itching   Shellfish Allergy     Mussels    MEDICATIONS:  Current Outpatient Medications  Medication Sig Dispense Refill   pantoprazole (PROTONIX) 40 MG tablet Take 40 mg by mouth daily.     Sodium Sulfate-Mag Sulfate-KCl (SUTAB) 206 160 5520 MG TABS Take 12 tablets by mouth as directed. 24 tablet 0   valsartan (DIOVAN) 80 MG tablet Take 80 mg by mouth daily.     venlafaxine XR (EFFEXOR XR) 37.5 MG 24 hr capsule Take 1 capsule (37.5 mg total) by mouth daily. 30 capsule 2   No current facility-administered medications for this encounter.    REVIEW OF SYSTEMS: A 10+ POINT REVIEW OF SYSTEMS WAS OBTAINED including neurology, dermatology, psychiatry, cardiac, respiratory, lymph, extremities, GI, GU, musculoskeletal, constitutional, reproductive, HEENT. On the provided form, she reports night sweats, loss of sleep, throbbing pelvic pain, joint pain in her right hip, difficulty walking due to left achilles tendon issues, and anxiety. She denies any other symptoms.    PHYSICAL  EXAM:   Vitals with BMI 02/20/2021  Height 5' 2"   Weight 188 lbs 3 oz  BMI 13.08  Systolic 657  Diastolic 90  Pulse 80   Lungs are clear to auscultation bilaterally. Heart has regular rate and rhythm. No palpable cervical, supraclavicular, or axillary adenopathy. Abdomen soft, non-tender, normal bowel sounds. Breast: Left breast with no palpable mass, nipple discharge, or bleeding. Right breast with significant bruising in the 12 o'clock position with a palpable hematoma measuring 3 cm in size. No nipple discharge or bleeding.  KPS = 90  100 - Normal; no complaints; no evidence of disease. 90   - Able to carry on normal activity; minor signs or symptoms of disease. 80   - Normal activity with effort; some signs or  symptoms of disease. 4   - Cares for self; unable to carry on normal activity or to do active work. 60   - Requires occasional assistance, but is able to care for most of his personal needs. 50   - Requires considerable assistance and frequent medical care. 19   - Disabled; requires special care and assistance. 62   - Severely disabled; hospital admission is indicated although death not imminent. 11   - Very sick; hospital admission necessary; active supportive treatment necessary. 10   - Moribund; fatal processes progressing rapidly. 0     - Dead  Karnofsky DA, Abelmann Enetai, Craver LS and Burchenal [facility] [phone]) The use of the nitrogen mustards in the palliative treatment of carcinoma: with particular reference to bronchogenic carcinoma Cancer 1 634-56  LABORATORY DATA:  Lab Results  Component Value Date   WBC 4.0 02/20/2021   HGB 13.7 02/20/2021   HCT 41.0 02/20/2021   MCV 92.6 02/20/2021   PLT 289 02/20/2021   Lab Results  Component Value Date   NA 138 02/20/2021   K 3.9 02/20/2021   CL 106 02/20/2021   CO2 22 02/20/2021   Lab Results  Component Value Date   ALT 21 02/20/2021   AST 33 02/20/2021   ALKPHOS 78 02/20/2021   BILITOT 1.0 02/20/2021    PULMONARY  FUNCTION TEST:   Recent Review Flowsheet Data   There is no flowsheet data to display.     RADIOGRAPHY: US BREAST LTD UNI RIGHT INC AXILLA  Result Date: 02/02/2021 CLINICAL DATA:  Screening recall for right breast calcs with adjacent area of distortion. EXAM: DIGITAL DIAGNOSTIC UNILATERAL RIGHT MAMMOGRAM WITH TOMOSYNTHESIS AND CAD; ULTRASOUND RIGHT BREAST LIMITED TECHNIQUE: Right digital diagnostic mammography and breast tomosynthesis was performed. The images were evaluated with computer-aided detection.; Targeted ultrasound examination of the right breast was performed COMPARISON:  Previous exams. ACR Breast Density Category b: There are scattered areas of fibroglandular density. FINDINGS: Additional tomograms as well as spot compression tomograms were performed of the right breast. There is a spiculated mass in the central upper right breast measuring approximately 0.7 cm. There are mixed punctate and amorphous calcifications adjacent to the mass in the upper slightly inner right breast spanning 1.1 cm. Targeted ultrasound of the right breast was performed. There is an irregular hypoechoic mass in the right breast at 12 o'clock 3 cm from nipple measuring 0.8 x 0.6 x 0.6 cm. This corresponds well with the mass seen in the right breast at mammography. No lymphadenopathy seen in right axilla. IMPRESSION: 1.  Suspicious 0.8 cm mass in the right breast at the 12 position. 2. Suspicious calcifications adjacent to the right breast mass spanning 1.1 cm. RECOMMENDATION: 1. Recommend ultrasound-guided biopsy of the mass in the right breast at the 12 o'clock position. 2. Recommend stereotactic guided biopsy of the calcifications in the upper slightly inner right breast. I have discussed the findings and recommendations with the patient. If applicable, a reminder letter will be sent to the patient regarding the next appointment. BI-RADS CATEGORY  5: Highly suggestive of malignancy. Electronically Signed   By:  Everlean Alstrom M.D.   On: 02/02/2021 11:49  MM DIAG BREAST TOMO UNI RIGHT  Result Date: 02/02/2021 CLINICAL DATA:  Screening recall for right breast calcs with adjacent area of distortion. EXAM: DIGITAL DIAGNOSTIC UNILATERAL RIGHT MAMMOGRAM WITH TOMOSYNTHESIS AND CAD; ULTRASOUND RIGHT BREAST LIMITED TECHNIQUE: Right digital diagnostic mammography and breast tomosynthesis was performed. The images were evaluated with computer-aided detection.; Targeted  ultrasound examination of the right breast was performed COMPARISON:  Previous exams. ACR Breast Density Category b: There are scattered areas of fibroglandular density. FINDINGS: Additional tomograms as well as spot compression tomograms were performed of the right breast. There is a spiculated mass in the central upper right breast measuring approximately 0.7 cm. There are mixed punctate and amorphous calcifications adjacent to the mass in the upper slightly inner right breast spanning 1.1 cm. Targeted ultrasound of the right breast was performed. There is an irregular hypoechoic mass in the right breast at 12 o'clock 3 cm from nipple measuring 0.8 x 0.6 x 0.6 cm. This corresponds well with the mass seen in the right breast at mammography. No lymphadenopathy seen in right axilla. IMPRESSION: 1.  Suspicious 0.8 cm mass in the right breast at the 12 position. 2. Suspicious calcifications adjacent to the right breast mass spanning 1.1 cm. RECOMMENDATION: 1. Recommend ultrasound-guided biopsy of the mass in the right breast at the 12 o'clock position. 2. Recommend stereotactic guided biopsy of the calcifications in the upper slightly inner right breast. I have discussed the findings and recommendations with the patient. If applicable, a reminder letter will be sent to the patient regarding the next appointment. BI-RADS CATEGORY  5: Highly suggestive of malignancy. Electronically Signed   By: Everlean Alstrom M.D.   On: 02/02/2021 11:49  MM CLIP PLACEMENT  RIGHT  Result Date: 02/08/2021 CLINICAL DATA:  Status post ultrasound-guided biopsy of a RIGHT breast mass at the 12 o'clock axis followed by stereotactic-guided biopsy of suspicious calcifications within the upper inner quadrant of the RIGHT breast. EXAM: 3D DIAGNOSTIC RIGHT MAMMOGRAM POST ULTRASOUND AND STEREOTACTIC BIOPSIES COMPARISON:  Previous exam(s). FINDINGS: 3D Mammographic images were obtained following ultrasound guided biopsy of a suspicious mass in the RIGHT breast at the 12 o'clock axis followed by stereotactic-guided biopsy of suspicious calcifications within the adjacent upper inner quadrant of the RIGHT breast. Both biopsy marking clips are in expected position at the sites of biopsy. IMPRESSION: 1. Appropriate positioning of the ribbon shaped biopsy marking clip at the site of biopsy in the upper RIGHT breast corresponding to the targeted mass at the 12 o'clock axis. 2. Appropriate positioning of the X shaped biopsy marking clip at the site of biopsy in the upper inner quadrant of the RIGHT breast corresponding to the targeted calcifications. 3. Small-to-moderate post biopsy hematoma at the stereotactic biopsy site. Final Assessment: Post Procedure Mammograms for Marker Placement Electronically Signed   By: Franki Cabot M.D.   On: 02/08/2021 08:57  MM RT BREAST BX W LOC DEV 1ST LESION IMAGE BX SPEC STEREO GUIDE  Result Date: 02/08/2021 CLINICAL DATA:  Patient with suspicious calcifications in the upper inner quadrant of the RIGHT breast presents today for stereotactic biopsy using 3D tomosynthesis guidance. Separate ultrasound-guided biopsy was performed earlier today for a suspicious mass in the RIGHT breast at the 12 o'clock axis. EXAM: RIGHT BREAST STEREOTACTIC CORE NEEDLE BIOPSY COMPARISON:  Previous exams. FINDINGS: The patient and I discussed the procedure of stereotactic-guided biopsy including benefits and alternatives. We discussed the high likelihood of a successful procedure. We  discussed the risks of the procedure including infection, bleeding, tissue injury, clip migration, and inadequate sampling. Informed written consent was given. The usual time out protocol was performed immediately prior to the procedure. Using sterile technique and 1% Lidocaine as local anesthetic, under stereotactic guidance, a 9 gauge vacuum assisted device was used to perform core needle biopsy of calcifications in the upper inner quadrant  of the RIGHT breast using a superior approach. Specimen radiograph was performed showing calcifications. Specimens with calcifications are identified for pathology. Lesion quadrant: Upper inner quadrant At the conclusion of the procedure, X shaped shaped tissue marker clip was deployed into the biopsy cavity. Follow-up 2-view mammogram was performed and dictated separately. IMPRESSION: Stereotactic-guided biopsy of suspicious calcifications within the upper inner quadrant of the RIGHT breast. Specimen sample labeled site 2. X shaped clip placed at the biopsy site. No apparent complications. Electronically Signed   By: Franki Cabot M.D.   On: 02/08/2021 08:54  [place] RT BREAST BX W LOC DEV 1ST LESION IMG BX SPEC US GUIDE  Addendum Date: 02/11/2021   ADDENDUM REPORT: 02/11/2021 16:28 ADDENDUM: Pathology revealed GRADE II INVASIVE DUCTAL CARCINOMA, DUCTAL CARCINOMA IN SITU, CALCIFICATIONS of the RIGHT breast, 12 o'clock, ribbon clip. This was found to be concordant by Dr. Franki Cabot. Pathology revealed FLAT EPITHELIAL ATYPIA WITH CALCIFICATIONS- LOBULAR NEOPLASIA (ATYPICAL LOBULAR HYPERPLASIA) of the RIGHT breast, upper inner quadrant, X clip. This was found to be discordant by Dr. Franki Cabot, with excision recommended with the site of invasive ductal carcinoma at 12 o'clock (clips are adjacent). Pathology results were discussed with the patient by telephone. The patient reported doing well after the biopsies with tenderness at the sites. Post biopsy instructions and care  were reviewed and questions were answered. The patient was encouraged to call [facility] for any additional concerns. The patient was referred to [facility] on February 20, 2021. Pathology results reported by Stacie Acres RN on 02/11/2021. Electronically Signed   By: Franki Cabot M.D.   On: 02/11/2021 16:28   Result Date: 02/11/2021 CLINICAL DATA:  Patient with a suspicious mass in the RIGHT breast presents today for ultrasound-guided core biopsy. Patient also with suspicious calcifications in the RIGHT breast for which stereotactic biopsy is scheduled later today. EXAM: ULTRASOUND GUIDED RIGHT BREAST CORE NEEDLE BIOPSY COMPARISON:  Previous exam(s). PROCEDURE: I met with the patient and we discussed the procedure of ultrasound-guided biopsy, including benefits and alternatives. We discussed the high likelihood of a successful procedure. We discussed the risks of the procedure, including infection, bleeding, tissue injury, clip migration, and inadequate sampling. Informed written consent was given. The usual time-out protocol was performed immediately prior to the procedure. Lesion quadrant: 12 o'clock Using sterile technique and 1% Lidocaine as local anesthetic, under direct ultrasound visualization, a 12 gauge spring-loaded device was used to perform biopsy of the RIGHT breast mass at the 12 o'clock axis, 3 cm from the nipple, using a lateral approach. At the conclusion of the procedure ribbon shaped tissue marker clip was deployed into the biopsy cavity. Patient also with suspicious calcifications in the RIGHT breast for which a stereotactic biopsy will be performed later today. Follow up 2 view mammogram will be performed and dictated separately after the stereotactic biopsy for calcifications. IMPRESSION: 1. Ultrasound guided biopsy of the RIGHT breast mass at the 12 o'clock axis. Specimen container  labeled SITE 1. Ribbon shaped clip placed at the biopsy site. No apparent complications. 2. Stereotactic biopsy for suspicious calcifications in the RIGHT breast will also be performed later today and dictated separately. Electronically Signed: By: Franki Cabot M.D. On: 02/08/2021 08:27     IMPRESSION: Stage IA (cT1b, cN0, cM0) Right Breast UOQ, Invasive and in-situ ductal carcinoma, ER+ / PR+ / Her2-, Grade 1-2   Patient will be a good candidate for  breast conservation with radiotherapy to right breast. We discussed the general course of radiation, potential side effects, and toxicities with radiation and the patient is interested in this approach.   Patient will be undergoing a second opinion at [facility] on November 9th but feels that she would likely receive all of her care in [place].   PLAN:  Genetics Right lumpectomy and SNL  Oncotype  Adjuvant radiation therapy  Aromatase inhibitor     ------------------------------------------------  Blair Promise, PhD, MD  This document serves as a record of services personally performed by Gery Pray, MD. It was created on his behalf by Roney Mans, a trained medical scribe. The creation of this record is based on the scribe's personal observations and the provider's statements to them. This document has been checked and approved by the attending provider.

## 2021-02-20 ENCOUNTER — Other Ambulatory Visit: Payer: Self-pay

## 2021-02-20 ENCOUNTER — Inpatient Hospital Stay (HOSPITAL_BASED_OUTPATIENT_CLINIC_OR_DEPARTMENT_OTHER): Payer: BC Managed Care – PPO | Admitting: Hematology

## 2021-02-20 ENCOUNTER — Ambulatory Visit: Payer: BC Managed Care – PPO | Admitting: Genetic Counselor

## 2021-02-20 ENCOUNTER — Encounter: Payer: Self-pay | Admitting: Physical Therapy

## 2021-02-20 ENCOUNTER — Other Ambulatory Visit: Payer: BC Managed Care – PPO

## 2021-02-20 ENCOUNTER — Encounter: Payer: Self-pay | Admitting: Hematology

## 2021-02-20 ENCOUNTER — Encounter: Payer: Self-pay | Admitting: Genetic Counselor

## 2021-02-20 ENCOUNTER — Encounter: Payer: Self-pay | Admitting: General Practice

## 2021-02-20 ENCOUNTER — Inpatient Hospital Stay: Payer: BC Managed Care – PPO

## 2021-02-20 ENCOUNTER — Ambulatory Visit
Admission: RE | Admit: 2021-02-20 | Discharge: 2021-02-20 | Disposition: A | Discharge status: Home / Self Care | Payer: BC Managed Care – PPO | Source: Ambulatory Visit | Attending: Radiation Oncology | Admitting: Radiation Oncology

## 2021-02-20 ENCOUNTER — Ambulatory Visit: Payer: BC Managed Care – PPO | Attending: General Surgery | Admitting: Physical Therapy

## 2021-02-20 VITALS — BP 155/90 | HR 80 | Temp 98.1°F | Resp 18 | Ht 62.0 in | Wt 188.2 lb

## 2021-02-20 DIAGNOSIS — Z803 Family history of malignant neoplasm of breast: Secondary | ICD-10-CM | POA: Insufficient documentation

## 2021-02-20 DIAGNOSIS — N951 Menopausal and female climacteric states: Secondary | ICD-10-CM

## 2021-02-20 DIAGNOSIS — E2839 Other primary ovarian failure: Secondary | ICD-10-CM

## 2021-02-20 DIAGNOSIS — R293 Abnormal posture: Secondary | ICD-10-CM | POA: Diagnosis not present

## 2021-02-20 DIAGNOSIS — Z170 Estrogen receptor positive status [ER+]: Secondary | ICD-10-CM

## 2021-02-20 DIAGNOSIS — Z8042 Family history of malignant neoplasm of prostate: Secondary | ICD-10-CM

## 2021-02-20 DIAGNOSIS — C50411 Malignant neoplasm of upper-outer quadrant of right female breast: Secondary | ICD-10-CM | POA: Diagnosis not present

## 2021-02-20 DIAGNOSIS — F419 Anxiety disorder, unspecified: Secondary | ICD-10-CM

## 2021-02-20 LAB — CBC WITH DIFFERENTIAL (CANCER CENTER ONLY)
Abs Immature Granulocytes: 0 10*3/uL (ref 0.00–0.07)
Basophils Absolute: 0.1 10*3/uL (ref 0.0–0.1)
Basophils Relative: 1 %
Eosinophils Absolute: 0.1 10*3/uL (ref 0.0–0.5)
Eosinophils Relative: 2 %
HCT: 41 % (ref 36.0–46.0)
Hemoglobin: 13.7 g/dL (ref 12.0–15.0)
Immature Granulocytes: 0 %
Lymphocytes Relative: 24 %
Lymphs Abs: 1 10*3/uL (ref 0.7–4.0)
MCH: 30.9 pg (ref 26.0–34.0)
MCHC: 33.4 g/dL (ref 30.0–36.0)
MCV: 92.6 fL (ref 80.0–100.0)
Monocytes Absolute: 0.6 10*3/uL (ref 0.1–1.0)
Monocytes Relative: 14 %
Neutro Abs: 2.4 10*3/uL (ref 1.7–7.7)
Neutrophils Relative %: 59 %
Platelet Count: 289 10*3/uL (ref 150–400)
RBC: 4.43 MIL/uL (ref 3.87–5.11)
RDW: 12.4 % (ref 11.5–15.5)
WBC Count: 4 10*3/uL (ref 4.0–10.5)
nRBC: 0 % (ref 0.0–0.2)

## 2021-02-20 LAB — CMP (CANCER CENTER ONLY)
ALT: 21 U/L (ref 0–44)
AST: 33 U/L (ref 15–41)
Albumin: 3.9 g/dL (ref 3.5–5.0)
Alkaline Phosphatase: 78 U/L (ref 38–126)
Anion gap: 10 (ref 5–15)
BUN: 11 mg/dL (ref 8–23)
CO2: 22 mmol/L (ref 22–32)
Calcium: 9.7 mg/dL (ref 8.9–10.3)
Chloride: 106 mmol/L (ref 98–111)
Creatinine: 0.8 mg/dL (ref 0.44–1.00)
GFR, Estimated: >60 mL/min (ref >60)
Glucose, Bld: 126 mg/dL — ABNORMAL HIGH (ref 70–99)
Potassium: 3.9 mmol/L (ref 3.5–5.1)
Sodium: 138 mmol/L (ref 135–145)
Total Bilirubin: 1 mg/dL (ref 0.3–1.2)
Total Protein: 7.3 g/dL (ref 6.5–8.1)

## 2021-02-20 LAB — GENETIC SCREENING ORDER

## 2021-02-20 MED ORDER — VENLAFAXINE HCL ER 37.5 MG PO CP24: 37.5000 mg | ORAL_CAPSULE | Freq: Every day | ORAL | 2 refills | Status: DC

## 2021-02-20 NOTE — Patient Instructions (Signed)

## 2021-02-20 NOTE — Progress Notes (Signed)
[facility] Psychosocial Distress Screening Spiritual Care  Met with Shenay and her husband in Breast Multidisciplinary Clinic to introduce [facility] team/resources, reviewing distress screen per protocol.  The patient scored a 9 on the Psychosocial Distress Thermometer which indicates severe distress. Also assessed for distress and other psychosocial needs.   ONCBCN DISTRESS SCREENING 02/20/2021  Screening Type Initial Screening  Distress experienced in past week (1-10) 9  Emotional problem type Nervousness/Anxiety  Information Concerns Type Lack of info about treatment  Physical Problem type Sleep/insomnia  Referral to support programs Yes   Ms Devine reports that attending meeting her team and learning about the scope of her diagnosis and treatment in [facility] helped to reduce her distress considerably. She notes that "having to give up hormones" has been a real challenge mood-wise, so she is hopeful that the non-hormonal prescription from Dr Burr Medico may help with anxiety/mood stabilization.  Follow up needed: No. Ms Lattner is aware of ongoing [facility] team and programming availability and prefers to reach out as needed/desired.   [place], [place], [facility] Pager [phone] Voicemail [phone]

## 2021-02-20 NOTE — Therapy (Signed)
[facility], [place], [address] Phone: [phone]   Fax:  [phone]  Physical Therapy Evaluation  Patient Details  Name: KALANIE FEWELL MRN: [id number] Date of Birth: 04/02/1961 Referring Provider (PT): Dr. Rolm Bookbinder   Encounter Date: 02/20/2021   PT End of Session - 02/20/21 1520     Visit Number 1    Number of Visits 2    Date for PT Re-Evaluation 04/17/21    PT Start Time 16    PT Stop Time 18   Aalso saw pt from 1450-1502 for a total of 33 min   PT Time Calculation (min) 21 min    Activity Tolerance Patient tolerated treatment well    Behavior During Therapy [facility] for tasks assessed/performed             Past Medical History:  Diagnosis Date   Allergy    shellfish   Breast cancer ([place])    Coronary atherosclerosis    Fatty liver disease, nonalcoholic    Food allergy    GERD (gastroesophageal reflux disease)    Hyperlipidemia    Hypertension    Spondylosis     Past Surgical History:  Procedure Laterality Date   ADENOIDECTOMY     cercalage     COLONOSCOPY     10 yrs ago   TONSILLECTOMY     TYMPANOSTOMY TUBE PLACEMENT      There were no vitals filed for this visit.    Subjective Assessment - 02/20/21 1506     Subjective Patient reports she is here today to be seen by her medical team for her newly diagnosed right breast cancer.    Patient is accompained by: Family member    Pertinent History Patient was diagnosed on 01/14/2021 with right grade I-II invasive ductal carcinoma breast cancer. It measures 8 mm and has an area od calicifications measuring 1.8 cm in the upper outer quadrant.. It is ER/PR positive and HER2 negative with a Ki67 of 1%. She has a hx of a mandibular surgery due to a benign tumor in 07/2020 and had to have a bone graft in 10/2020. She is missing multiple teeth on the bottom left side.    Patient Stated Goals Reduce lymphedema risk and learn post op  ROM HEP    Currently in Pain? No/denies                [facility] PT Assessment - 02/20/21 0001       Assessment   Medical Diagnosis Right breast cancer    Referring Provider (PT) Dr. Rolm Bookbinder    Onset Date/Surgical Date 01/14/21    Hand Dominance Right    Prior Therapy none      Precautions   Precautions Other (comment)    Precaution Comments Active cancer      Restrictions   Weight Bearing Restrictions No      Balance Screen   Has the patient fallen in the past 6 months No    Has the patient had a decrease in activity level because of a fear of falling?  No    Is the patient reluctant to leave their home because of a fear of falling?  No      Home Environment   Living Environment Private residence    Living Arrangements Spouse/significant other    Available Help at Discharge Family      Prior Function   Level of Independence Independent  Vocation Retired    Leisure She does not exercise      Cognition   Overall Cognitive Status Within Functional Limits for tasks assessed      Posture/Postural Control   Posture/Postural Control Postural limitations    Postural Limitations Rounded Shoulders;Forward head      ROM / Strength   AROM / PROM / Strength AROM;Strength      AROM   Overall AROM Comments Cervical AROM is WNL    AROM Assessment Site Shoulder    Right/Left Shoulder Right;Left    Right Shoulder Extension 60 Degrees    Right Shoulder Flexion 158 Degrees    Right Shoulder ABduction 158 Degrees    Right Shoulder Internal Rotation 71 Degrees    Right Shoulder External Rotation 84 Degrees    Left Shoulder Extension 57 Degrees    Left Shoulder Flexion 147 Degrees    Left Shoulder ABduction 158 Degrees    Left Shoulder Internal Rotation 84 Degrees    Left Shoulder External Rotation 81 Degrees      Strength   Overall Strength Within functional limits for tasks performed               LYMPHEDEMA/ONCOLOGY QUESTIONNAIRE - 02/20/21 0001        Type   Cancer Type Right breast cancer      Lymphedema Assessments   Lymphedema Assessments Upper extremities      Right Upper Extremity Lymphedema   10 cm Proximal to Olecranon Process 32.6 cm    Olecranon Process 26 cm    10 cm Proximal to Ulnar Styloid Process 22.9 cm    Just Proximal to Ulnar Styloid Process 14.8 cm    Across Hand at [employer] 18.3 cm    At [place] of 2nd Digit 6.2 cm      Left Upper Extremity Lymphedema   10 cm Proximal to Olecranon Process 33.8 cm    Olecranon Process 25.5 cm    10 cm Proximal to Ulnar Styloid Process 22 cm    Just Proximal to Ulnar Styloid Process 14.4 cm    Across Hand at [employer] 17.1 cm    At [place] of 2nd Digit 6.2 cm             L-DEX FLOWSHEETS - 02/20/21 1500       L-DEX LYMPHEDEMA SCREENING   Measurement Type Unilateral    L-DEX MEASUREMENT EXTREMITY Upper Extremity    POSITION  Standing    DOMINANT SIDE Right    At Risk Side Right    BASELINE SCORE (UNILATERAL) -3.9             The patient was assessed using the L-Dex machine today to produce a lymphedema index baseline score. The patient will be reassessed on a regular basis (typically every 3 months) to obtain new L-Dex scores. If the score is > 6.5 points away from his/her baseline score indicating onset of subclinical lymphedema, it will be recommended to wear a compression garment for 4 weeks, 12 hours per day and then be reassessed. If the score continues to be > 6.5 points from baseline at reassessment, we will initiate lymphedema treatment. Assessing in this manner has a 95% rate of preventing clinically significant lymphedema.      Katina Dung - 02/20/21 0001     Open a tight or new jar Mild difficulty    Do heavy household chores (wash walls, wash floors) No difficulty    Carry a shopping bag  or briefcase No difficulty    Wash your back No difficulty    Use a knife to cut food No difficulty    Recreational activities in which you take some  force or impact through your arm, shoulder, or hand (golf, hammering, tennis) No difficulty    During the past week, to what extent has your arm, shoulder or hand problem interfered with your normal social activities with family, friends, neighbors, or groups? Not at all    During the past week, to what extent has your arm, shoulder or hand problem limited your work or other regular daily activities Not at all    Arm, shoulder, or hand pain. Mild    Tingling (pins and needles) in your arm, shoulder, or hand None    Difficulty Sleeping No difficulty    DASH Score 4.55 %              Objective measurements completed on examination: See above findings.          Patient was instructed today in a home exercise program today for post op shoulder range of motion. These included active assist shoulder flexion in sitting, scapular retraction, wall walking with shoulder abduction, and hands behind head external rotation.  She was encouraged to do these twice a day, holding 3 seconds and repeating 5 times when permitted by her physician.        PT Education - 02/20/21 1519     Education Details Lymphedema risk reduction and post op ROM HEP    Person(s) Educated Patient;Spouse    Methods Explanation;Demonstration;Handout    Comprehension Returned demonstration;Verbalized understanding                 PT Long Term Goals - 02/20/21 1605       PT LONG TERM GOAL #1   Title Patient will dmeonstrate she has regained full shoulder ROM and function post operatively compared to baselines.    Time 8    Period Weeks    Status New    Target Date 04/17/21             Breast Clinic Goals - 02/20/21 1603       Patient will be able to verbalize understanding of pertinent lymphedema risk reduction practices relevant to her diagnosis specifically related to skin care.   Time 1    Period Days    Status Achieved      Patient will be able to return demonstrate and/or verbalize  understanding of the post-op home exercise program related to regaining shoulder range of motion.   Time 1    Period Days    Status Achieved      Patient will be able to verbalize understanding of the importance of attending the postoperative After Breast Cancer Class for further lymphedema risk reduction education and therapeutic exercise.   Time 1    Period Days    Status Achieved                   Plan - 02/20/21 1522     Clinical Impression Statement Patient was diagnosed on 01/14/2021 with right grade I-II invasive ductal carcinoma breast cancer. It measures 8 mm and has an area od calicifications measuring 1.8 cm in the upper outer quadrant.. It is ER/PR positive and HER2 negative with a Ki67 of 1%. She has a hx of a mandibular surgery due to a benign tumor in 07/2020 and had to have a bone graft in 10/2020. She  is missing multiple teeth on the bottom left side. Her multidisciplinary medical team met prior to her assessments to determine a recommended treatment plan. She is planning to have a right lumpectomy and sentinel node biopsy followed by Oncotype testing, radiation, and anti-estrogen therapy. She will benefit from a post op PT reassessment to determine needs and from L-Dex screens every 3 months for 2 years to detect subclinical lymphedema.    Stability/Clinical Decision Making Stable/Uncomplicated    Clinical Decision Making Low    Rehab Potential Excellent    PT Frequency --   Eval and 1 f/u visit   PT Treatment/Interventions ADLs/Self Care Home Management;Therapeutic exercise;Patient/family education    PT Next Visit Plan Will reassess 3-4 weeks post op    PT Home Exercise Plan Post op HEP    Consulted and Agree with Plan of Care Patient;Family member/caregiver    Family Member Consulted Husband             Patient will benefit from skilled therapeutic intervention in order to improve the following deficits and impairments:  Postural dysfunction, Decreased range  of motion, Decreased knowledge of precautions, Impaired UE functional use, Pain  Visit Diagnosis: Malignant neoplasm of upper-outer quadrant of right breast in female, estrogen receptor positive ([place]) - Plan: PT plan of care cert/re-cert  Abnormal posture - Plan: PT plan of care cert/re-cert  Patient will follow up at outpatient cancer rehab 3-4 weeks following surgery.  If the patient requires physical therapy at that time, a specific plan will be dictated and sent to the referring physician for approval. The patient was educated today on appropriate basic range of motion exercises to begin post operatively and the importance of attending the After Breast Cancer class following surgery.  Patient was educated today on lymphedema risk reduction practices as it pertains to recommendations that will benefit the patient immediately following surgery.  She verbalized good understanding.      Problem List Patient Active Problem List   Diagnosis Date Noted   Malignant neoplasm of upper-outer quadrant of right breast in female, estrogen receptor positive ([place]) 02/15/2021   Annia Friendly, PT 02/20/21 4:08 PM   [facility] @ [place], [place], [address] Phone: [phone]   Fax:  [phone]  Name: KAMRON PORTEE MRN: [id number] Date of Birth: [birth year]

## 2021-02-20 NOTE — Progress Notes (Signed)
REFERRING PROVIDER: Truitt Merle, MD [address] [place],  [place] [address]  PRIMARY PROVIDER:  Shon Baton, MD  PRIMARY REASON FOR VISIT:  1. Malignant neoplasm of upper-outer quadrant of right breast in female, estrogen receptor positive ([place])   2. Family history of breast cancer   3. Family history of prostate cancer     HISTORY OF PRESENT ILLNESS:   Ms. Devall, a 60 y.o. female, was seen for a [place] cancer genetics consultation during the breast multidisciplinary clinic at the request of Dr. Burr Medico due to a personal and family history of cancer.  Ms. Harjo presents to clinic today to discuss the possibility of a hereditary predisposition to cancer, to discuss genetic testing, and to further clarify her future cancer risks, as well as potential cancer risks for family members.   In October 2022, at the age of 56, Ms. Gleghorn was diagnosed with invasive ductal carcinoma of the right breast. The preliminary treatment plan is pending.   CANCER HISTORY:  Oncology History Overview Note  Cancer Staging Malignant neoplasm of upper-outer quadrant of right breast in female, estrogen receptor positive ([place]) Staging form: Breast, AJCC 8th Edition - Clinical stage from 02/08/2021: Stage IA (cT1b, cN0, cM0, G2, ER+, PR+, HER2-) - Signed by Truitt Merle, MD on 02/19/2021    Malignant neoplasm of upper-outer quadrant of right breast in female, estrogen receptor positive ([place])  02/02/2021 Mammogram   Right Diagnostic MM and Right Breast US  IMPRESSION: 1.  Suspicious 0.8 cm mass in the right breast at the 12 position. 2. Suspicious calcifications adjacent to the right breast mass spanning 1.1 cm.  No lymphadenopathy seen in right axilla.   02/08/2021 Cancer Staging   Staging form: Breast, AJCC 8th Edition - Clinical stage from 02/08/2021: Stage IA (cT1b, cN0, cM0, G2, ER+, PR+, HER2-) - Signed by Truitt Merle, MD on 02/19/2021 Stage prefix: Initial diagnosis Histologic grading system: 3  grade system    02/08/2021 Pathology Results   Diagnosis 1. Breast, right, needle core biopsy, 12 o'clock, ribbon shaped clip - INVASIVE DUCTAL CARCINOMA - DUCTAL CARCINOMA IN SITU - CALCIFICATIONS - SEE COMMENT 2. Breast, right, needle core biopsy, upper inner quadrant, x shaped clip - FLAT EPITHELIAL ATYPIA WITH CALCIFICATIONS - LOBULAR NEOPLASIA (ATYPICAL LOBULAR HYPERPLASIA) Microscopic Comment 1. Based on the biopsy, the carcinoma appears Nottingham grade 1-2 of 3 and measures 0.6 cm in greatest linear extent.  1. PROGNOSTIC INDICATORS Results: The tumor cells are EQUIVOCAL for Her2 (2+). Her2 by FISH will be performed and results reported separately. Estrogen Receptor: >95%, POSITIVE, STRONG STAINING INTENSITY Progesterone Receptor: >95%, POSITIVE, STRONG STAINING INTENSITY Proliferation Marker Ki67: 1%  1. FLUORESCENCE IN-SITU HYBRIDIZATION Results: GROUP 5: HER2 **NEGATIVE**   02/15/2021 Initial Diagnosis   Malignant neoplasm of upper-outer quadrant of right breast in female, estrogen receptor positive ([place])     RISK FACTORS:  Menarche was at age 89.  First live birth at age 73.  OCP use for approximately 0 years.  Ovaries intact: yes.  Hysterectomy: no.  Menopausal status: postmenopausal.  HRT use: 10 years. Colonoscopy: no;  most recent in 2021 . Mammogram within the last year: yes. Up to date with pelvic exams: most recent PAP in 2021.  Past Medical History:  Diagnosis Date   Allergy    shellfish   Breast cancer ([place])    Coronary atherosclerosis    Fatty liver disease, nonalcoholic    Food allergy    GERD (gastroesophageal reflux disease)    Hyperlipidemia  Hypertension    Spondylosis     Past Surgical History:  Procedure Laterality Date   ADENOIDECTOMY     cercalage     COLONOSCOPY     10 yrs ago   TONSILLECTOMY     TYMPANOSTOMY TUBE PLACEMENT      Social History   Socioeconomic History   Marital status: Married    Spouse name: Not  on file   Number of children: 2   Years of education: Not on file   Highest education level: Not on file  Occupational History   Not on file  Tobacco Use   Smoking status: Never   Smokeless tobacco: Never   Tobacco comments:    smoked in college  Vaping Use   Vaping Use: Never used  Substance and Sexual Activity   Alcohol use: Not Currently    Alcohol/week: 5.0 standard drinks    Types: 5 Glasses of wine per week    Comment: I have not had much wine since July.   Drug use: Never   Sexual activity: Yes    Birth control/protection: Post-menopausal  Other Topics Concern   Not on file  Social History Narrative   Not on file   Social Determinants of Health   Financial Resource Strain: Not on file  Food Insecurity: Not on file  Transportation Needs: Not on file  Physical Activity: Not on file  Stress: Not on file  Social Connections: Not on file     FAMILY HISTORY:  We obtained a detailed, 4-generation family history.  Significant diagnoses are listed below: Family History  Problem Relation Age of Onset   Cancer Father 22       unknown primary; mets   Prostate cancer Maternal Grandfather 78       metastatic   Cancer Paternal Grandfather 68       unknown type; ? lung   Breast cancer Other        MGF's mother; dx unknown age     Ms. Angell is unaware of previous family history of genetic testing for hereditary cancer risks. here is no reported Ashkenazi Jewish ancestry. There is no known consanguinity.  GENETIC COUNSELING ASSESSMENT: Ms. Rigdon is a 60 y.o. female with a personal and family history of cancer which is somewhat suggestive of a hereditary cancer syndrome and predisposition to cancer given the presence of related cancers in her maternal family. We, therefore, discussed and recommended the following at today's visit.   DISCUSSION: We discussed that 5 - 10% of cancer is hereditary, with most cases of hereditary breast cancer associated with mutations in  BRCA1/2.  There are other genes that can be associated with hereditary breast and prostate cancer syndromes.  Type of cancer risk and level of risk are gene-specific. We discussed that testing is beneficial for several reasons including knowing how to follow individuals after completing their treatment, identifying whether potential treatment options would be beneficial, and understanding if other family members could be at risk for cancer and allowing them to undergo genetic testing.   We reviewed the characteristics, features and inheritance patterns of hereditary cancer syndromes. We also discussed genetic testing, including the appropriate family members to test, the process of testing, insurance coverage and turn-around-time for results. We discussed the implications of a negative, positive and/or variant of uncertain significant result. In order to get genetic test results in a timely manner so that Ms. Topete can use these genetic test results for surgical decisions, we recommended Ms. Effie Shy pursue  genetic testing for the [facility] BRCAPlus STAT Panel.  The BRCAplus panel offered by [employer] and includes sequencing and deletion/duplication analysis for the following 8 genes: ATM, BRCA1, BRCA2, CDH1, CHEK2, PALB2, PTEN, and TP53. Once complete, we recommend Ms. Bivens pursue reflex genetic testing to a more comprehensive gene panel.   Ms. Livas  was offered a common hereditary cancer panel (47 genes) and an expanded pan-cancer panel (77 genes). Ms. Torrez was informed of the benefits and limitations of each panel, including that expanded pan-cancer panels contain genes that do not have clear management guidelines at this point in time.  We also discussed that as the number of genes included on a panel increases, the chances of variants of uncertain significance increases.  After considering the benefits and limitations of each gene panel, Ms. Quest  elected to have a common hereditary cancers panel through  [place].  The CustomNext-Cancer+RNAinsight panel offered by [facility] includes sequencing and rearrangement analysis for the following 47 genes:  APC, ATM, AXIN2, BARD1, BMPR1A, BRCA1, BRCA2, BRIP1, CDH1, CDK4, CDKN2A, CHEK2, DICER1, EPCAM, GREM1, HOXB13, MEN1, MLH1, MSH2, MSH3, MSH6, MUTYH, NBN, NF1, NF2, NTHL1, PALB2, PMS2, POLD1, POLE, PTEN, RAD51C, RAD51D, RECQL, RET, SDHA, SDHAF2, SDHB, SDHC, SDHD, SMAD4, SMARCA4, STK11, TP53, TSC1, TSC2, and VHL.  RNA data is routinely analyzed for use in variant interpretation for all genes.   Based on Ms. Lutterman's personal and family history of cancer, she meets medical criteria for genetic testing. Despite that she meets criteria, she may still have an out of pocket cost. We discussed that if her out of pocket cost for testing is over $100, the laboratory should contact them to discuss self-pay prices, patient pay assistance programs, if applicable, and other billing options.   PLAN: After considering the risks, benefits, and limitations, Ms. Rhines provided informed consent to pursue genetic testing and the blood sample was sent to [facility] for analysis of the BRCAPlus STAT and CustomNext-Cancer +RNAinsight Panel. Results should be available within approximately 1-2 weeks' time, at which point they will be disclosed by telephone to Ms. Profitt, as will any additional recommendations warranted by these results. Ms. Riggins will receive a summary of her genetic counseling visit and a copy of her results once available. This information will also be available in Epic.   Lastly, we encouraged Ms. Bartelson to remain in contact with cancer genetics annually so that we can continuously update the family history and inform her of any changes in cancer genetics and testing that may be of benefit for this family.   Ms. Burdick questions were answered to her satisfaction today. Our contact information was provided should additional questions or concerns arise. Thank you  for the referral and allowing [place] to share in the care of your patient.   Daveena Elmore M. Joette Catching, [place], [facility] Genetic Counselor [email or web address] (P) [phone]  The patient was seen for a total of 20 minutes in face-to-face and was accompanied by her husband, Ted. Drs. Magrinat, Lindi Adie and/or Burr Medico were available to discuss this case as needed.  _______________________________________________________________________ For Office Staff:  Number of people involved in session: 2 Was an Intern/ student involved with case: no

## 2021-02-20 NOTE — Progress Notes (Signed)
[place]   [phone] [phone] Mutual Note   Patient Care Team: Shon Baton, MD as PCP - General (Internal Medicine) Rockwell Germany, RN as Oncology Nurse Navigator Mauro Kaufmann, RN as Oncology Nurse Navigator Rolm Bookbinder, MD as Consulting Physician (General Surgery) Truitt Merle, MD as Consulting Physician (Hematology) Gery Pray, MD as Consulting Physician (Radiation Oncology)  Date of Service:  02/20/2021   CHIEF COMPLAINTS/PURPOSE OF CONSULTATION:  Right Breast Cancer, ER+  REFERRING PHYSICIAN:  [facility]   ASSESSMENT & PLAN:  Donna Mcconnell is a 60 y.o. postmenopausal female with a history of   1. Malignant neoplasm of upper-outer quadrant of right breast, IDC and DCIS, Stage IA, c(T1b, N0), ER+/PR+/HER2-, Grade 2  -found on screening mammogram. Right diagnostic MM and US showed 0.8 cm mass at 12 o'clock with adjacent calcifications. Biopsy 02/08/21 confirmed IDC with DCIS.  --We discussed her imaging findings and the biopsy results in great details. -Given the low grade grade disease, she likely need a lumpectomy. She is agreeable with that. She was seen by Dr. Donne Hazel today and likely will proceed with surgery soon.  -I recommend a Oncotype Dx test on the surgical sample and we'll make a decision about adjuvant chemotherapy based on the Oncotype result. Written material of this test was given to her. She is young and fit, would be a good candidate for chemotherapy if her Oncotype recurrence score is high. -If her surgical sentinel lymph node is positive, I recommend mammaprint for further risk stratification and guide adjuvant chemotherapy. -Giving the strong ER and PR expression in her postmenopausal status, I recommend adjuvant endocrine therapy with aromatase inhibitor for a total of 5-10 years to reduce the risk of cancer recurrence. Potential benefits and side effects were discussed with patient  and she is interested. -She was also seen by radiation oncologist Dr. Sondra Come today. She will likely benefit from breast radiation if she undergo lumpectomy to decrease the risk of breast cancer. -We also discussed the breast cancer surveillance after her surgery. She will continue annual screening mammogram, self exam, and a routine office visit with lab and exam with [place]. -I encouraged her to have healthy diet and exercise regularly.   2. Bone Health  -Her most recent DEXA was ~10 years ago. We will obtain a new one prior to starting antiestrogens.  3. Hot Flashes, Anxiety -was on HRT for many years, stopped with cancer diagnosis -has trouble sleeping and anxiety with new diagnosis. I offered to start her on Effexor. She would like to try.   PLAN:  -I called in Effexor for her -I ordered DEXA to be done soon. -she will proceed with lumpectomy under Dr. Donne Hazel soon -Oncotype on her surgical sample  -I will see her back around the last week of her radiation to finalize her antiestrogen therapy plan, or sooner if Oncytype shows high risk disease.   Oncology History Overview Note  Cancer Staging Malignant neoplasm of upper-outer quadrant of right breast in female, estrogen receptor positive ([place]) Staging form: Breast, AJCC 8th Edition - Clinical stage from 02/08/2021: Stage IA (cT1b, cN0, cM0, G2, ER+, PR+, HER2-) - Signed by Truitt Merle, MD on 02/19/2021    Malignant neoplasm of upper-outer quadrant of right breast in female, estrogen receptor positive ([place])  02/02/2021 Mammogram   Right Diagnostic MM and Right Breast US  IMPRESSION: 1.  Suspicious 0.8 cm mass in the right breast at the 12 position. 2.  Suspicious calcifications adjacent to the right breast mass spanning 1.1 cm.  No lymphadenopathy seen in right axilla.   02/08/2021 Cancer Staging   Staging form: Breast, AJCC 8th Edition - Clinical stage from 02/08/2021: Stage IA (cT1b, cN0, cM0, G2, ER+, PR+, HER2-) - Signed by  Truitt Merle, MD on 02/19/2021 Stage prefix: Initial diagnosis Histologic grading system: 3 grade system    02/08/2021 Pathology Results   Diagnosis 1. Breast, right, needle core biopsy, 12 o'clock, ribbon shaped clip - INVASIVE DUCTAL CARCINOMA - DUCTAL CARCINOMA IN SITU - CALCIFICATIONS - SEE COMMENT 2. Breast, right, needle core biopsy, upper inner quadrant, x shaped clip - FLAT EPITHELIAL ATYPIA WITH CALCIFICATIONS - LOBULAR NEOPLASIA (ATYPICAL LOBULAR HYPERPLASIA) Microscopic Comment 1. Based on the biopsy, the carcinoma appears Nottingham grade 1-2 of 3 and measures 0.6 cm in greatest linear extent.  1. PROGNOSTIC INDICATORS Results: The tumor cells are EQUIVOCAL for Her2 (2+). Her2 by FISH will be performed and results reported separately. Estrogen Receptor: >95%, POSITIVE, STRONG STAINING INTENSITY Progesterone Receptor: >95%, POSITIVE, STRONG STAINING INTENSITY Proliferation Marker Ki67: 1%  1. FLUORESCENCE IN-SITU HYBRIDIZATION Results: GROUP 5: HER2 **NEGATIVE**   02/15/2021 Initial Diagnosis   Malignant neoplasm of upper-outer quadrant of right breast in female, estrogen receptor positive ([place])      HISTORY OF PRESENTING ILLNESS:  Donna Mcconnell 59 y.o. female is a here because of breast cancer. The patient was [facility] by [facility]. The patient presents to the clinic today accompanied by her husband.   She had routine screening mammography on 01/14/21 showing a possible abnormality in the right breast. She underwent right diagnostic mammography and right breast ultrasonography on 02/02/21 showing: suspicious 0.8 cm mass at 12 o'clock with suspicious adjacent calcifications spanning 1.1 cm; no lymphadenopathy.  Biopsy on 02/08/21 showed: invasive ductal carcinoma, grade 1-2, and DCIS with calcifications. Prognostic indicators significant for: estrogen receptor, >95% positive and progesterone receptor, >95% positive. Proliferation marker Ki67 at 1%. HER2 negative  by FISH.    Today the patient notes they felt/feeling prior/after... -she went off her estrogen therapy a little over a week ago.  -between being off her HRT and the new diagnosis, she has anxiety and issues sleeping -she reports pelvic pain   She has a PMHx of.... 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 -married with two children, first at age 93. They are now 63 and 33. 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 -moved to [place] in 2019 from [place] because of his husband's job -father with both grandfathers, one with likely prostate and the other metastatic to bone with unsure primary.   GYN HISTORY  Menarchal: ~60 years old LMP: ~2010-2012 Contraceptive: never used HRT: "for years and years" ... "I skipped menopause" stopped 02/11/21 G3P: 2, first at age 58   REVIEW OF SYSTEMS:    Constitutional: Denies fevers, chills, (+) night sweats Eyes: Denies blurriness of vision, double vision or watery eyes Ears, nose, mouth, throat, and face: Denies mucositis or sore throat Respiratory: Denies cough, dyspnea or wheezes Cardiovascular: Denies palpitation, chest discomfort or lower extremity swelling Gastrointestinal:  Denies nausea, heartburn or change in bowel habits Skin: Denies abnormal skin rashes Lymphatics: Denies new lymphadenopathy or easy bruising Neurological:Denies numbness, tingling or new weaknesses Behavioral/Psych: (+) anxiety  All other systems were reviewed with the patient and are negative.   MEDICAL HISTORY:  Past Medical History:  Diagnosis Date   Allergy  shellfish   Breast cancer ([place])    Coronary atherosclerosis    Fatty liver disease, nonalcoholic    Food allergy    GERD (gastroesophageal reflux disease)    Hyperlipidemia    Hypertension    Spondylosis     SURGICAL HISTORY: Past Surgical History:  Procedure Laterality Date   ADENOIDECTOMY     cercalage     COLONOSCOPY      10 yrs ago   TONSILLECTOMY     TYMPANOSTOMY TUBE PLACEMENT      SOCIAL HISTORY: Social History   Socioeconomic History   Marital status: Married    Spouse name: Not on file   Number of children: 2   Years of education: Not on file   Highest education level: Not on file  Occupational History   Not on file  Tobacco Use   Smoking status: Never   Smokeless tobacco: Never   Tobacco comments:    smoked in college  Vaping Use   Vaping Use: Never used  Substance and Sexual Activity   Alcohol use: Not Currently    Alcohol/week: 5.0 standard drinks    Types: 5 Glasses of wine per week    Comment: I have not had much wine since July.   Drug use: Never   Sexual activity: Yes    Birth control/protection: Post-menopausal  Other Topics Concern   Not on file  Social History Narrative   Not on file   Social Determinants of Health   Financial Resource Strain: Not on file  Food Insecurity: Not on file  Transportation Needs: Not on file  Physical Activity: Not on file  Stress: Not on file  Social Connections: Not on file  Intimate Partner Violence: Not on file    FAMILY HISTORY: Family History  Problem Relation Age of Onset   Heart attack Mother 58       deceased   Early death Mother    Heart disease Mother    Obesity Mother    Cancer Father 28       unknown primary; mets   Obesity Father    Varicose Veins Father    Obesity Maternal Grandmother    Prostate cancer Maternal Grandfather 64       metastatic   Arthritis Maternal Grandfather    Obesity Paternal Grandmother    Stroke Paternal Grandmother    Varicose Veins Paternal Grandmother    Arthritis Paternal Grandfather    Cancer Paternal Grandfather 68       unknown type; ? lung   Breast cancer Other        MGF's mother; dx unknown age   Colon cancer Neg Hx    Colon polyps Neg Hx    Esophageal cancer Neg Hx    Stomach cancer Neg Hx    Rectal cancer Neg Hx     ALLERGIES:  is allergic to other and shellfish  allergy.  MEDICATIONS:  Current Outpatient Medications  Medication Sig Dispense Refill   venlafaxine XR (EFFEXOR XR) 37.5 MG 24 hr capsule Take 1 capsule (37.5 mg total) by mouth daily. 30 capsule 2   pantoprazole (PROTONIX) 40 MG tablet Take 40 mg by mouth daily.     Sodium Sulfate-Mag Sulfate-KCl (SUTAB) [phone] MG TABS Take 12 tablets by mouth as directed. 24 tablet 0   valsartan (DIOVAN) 80 MG tablet Take 80 mg by mouth daily.     No current facility-administered medications for this visit.    PHYSICAL EXAMINATION: ECOG PERFORMANCE STATUS: 0 -  Asymptomatic  Vitals:   02/20/21 1244  BP: (!) 155/90  Pulse: 80  Resp: 18  Temp: 98.1 F (36.7 C)  SpO2: 100%   Filed Weights   02/20/21 1244  Weight: 85.4 kg    GENERAL:alert, no distress and comfortable SKIN: skin color, texture, turgor are normal, no rashes or significant lesions EYES: normal, Conjunctiva are pink and non-injected, sclera clear  NECK: supple, thyroid normal size, non-tender, without nodularity LYMPH:  no palpable lymphadenopathy in the cervical, axillary  LUNGS: clear to auscultation and percussion with normal breathing effort HEART: regular rate & rhythm and no murmurs and no lower extremity edema ABDOMEN:abdomen soft, non-tender and normal bowel sounds Musculoskeletal:no cyanosis of digits and no clubbing  NEURO: alert & oriented x 3 with fluent speech, no focal motor/sensory deficits BREAST: 3.5 cm x 4 cm hematoma at 12 o'clock in right breast. No palpable mass, nodules or adenopathy bilaterally. Breast exam benign.  LABORATORY DATA:  I have reviewed the data as listed CBC Latest Ref Rng & Units 02/20/2021  WBC 4.0 - 10.5 K/uL 4.0  Hemoglobin 12.0 - 15.0 g/dL 13.7  Hematocrit 36.0 - 46.0 % 41.0  Platelets 150 - 400 K/uL 289    CMP Latest Ref Rng & Units 02/20/2021  Glucose 70 - 99 mg/dL 126(H)  BUN 8 - 23 mg/dL 11  Creatinine 0.44 - 1.00 mg/dL 0.80  Sodium 135 - 145 mmol/L 138  Potassium  3.5 - 5.1 mmol/L 3.9  Chloride 98 - 111 mmol/L 106  CO2 22 - 32 mmol/L 22  Calcium 8.9 - 10.3 mg/dL 9.7  Total Protein 6.5 - 8.1 g/dL 7.3  Total Bilirubin 0.3 - 1.2 mg/dL 1.0  Alkaline Phos 38 - 126 U/L 78  AST 15 - 41 U/L 33  ALT 0 - 44 U/L 21     RADIOGRAPHIC STUDIES: I have personally reviewed the radiological images as listed and agreed with the findings in the report. US BREAST LTD UNI RIGHT INC AXILLA  Result Date: 02/02/2021 CLINICAL DATA:  Screening recall for right breast calcs with adjacent area of distortion. EXAM: DIGITAL DIAGNOSTIC UNILATERAL RIGHT MAMMOGRAM WITH TOMOSYNTHESIS AND CAD; ULTRASOUND RIGHT BREAST LIMITED TECHNIQUE: Right digital diagnostic mammography and breast tomosynthesis was performed. The images were evaluated with computer-aided detection.; Targeted ultrasound examination of the right breast was performed COMPARISON:  Previous exams. ACR Breast Density Category b: There are scattered areas of fibroglandular density. FINDINGS: Additional tomograms as well as spot compression tomograms were performed of the right breast. There is a spiculated mass in the central upper right breast measuring approximately 0.7 cm. There are mixed punctate and amorphous calcifications adjacent to the mass in the upper slightly inner right breast spanning 1.1 cm. Targeted ultrasound of the right breast was performed. There is an irregular hypoechoic mass in the right breast at 12 o'clock 3 cm from nipple measuring 0.8 x 0.6 x 0.6 cm. This corresponds well with the mass seen in the right breast at mammography. No lymphadenopathy seen in right axilla. IMPRESSION: 1.  Suspicious 0.8 cm mass in the right breast at the 12 position. 2. Suspicious calcifications adjacent to the right breast mass spanning 1.1 cm. RECOMMENDATION: 1. Recommend ultrasound-guided biopsy of the mass in the right breast at the 12 o'clock position. 2. Recommend stereotactic guided biopsy of the calcifications in the upper  slightly inner right breast. I have discussed the findings and recommendations with the patient. If applicable, a reminder letter will be sent to the patient regarding the  next appointment. BI-RADS CATEGORY  5: Highly suggestive of malignancy. Electronically Signed   By: Everlean Alstrom M.D.   On: 02/02/2021 11:49  MM DIAG BREAST TOMO UNI RIGHT  Result Date: 02/02/2021 CLINICAL DATA:  Screening recall for right breast calcs with adjacent area of distortion. EXAM: DIGITAL DIAGNOSTIC UNILATERAL RIGHT MAMMOGRAM WITH TOMOSYNTHESIS AND CAD; ULTRASOUND RIGHT BREAST LIMITED TECHNIQUE: Right digital diagnostic mammography and breast tomosynthesis was performed. The images were evaluated with computer-aided detection.; Targeted ultrasound examination of the right breast was performed COMPARISON:  Previous exams. ACR Breast Density Category b: There are scattered areas of fibroglandular density. FINDINGS: Additional tomograms as well as spot compression tomograms were performed of the right breast. There is a spiculated mass in the central upper right breast measuring approximately 0.7 cm. There are mixed punctate and amorphous calcifications adjacent to the mass in the upper slightly inner right breast spanning 1.1 cm. Targeted ultrasound of the right breast was performed. There is an irregular hypoechoic mass in the right breast at 12 o'clock 3 cm from nipple measuring 0.8 x 0.6 x 0.6 cm. This corresponds well with the mass seen in the right breast at mammography. No lymphadenopathy seen in right axilla. IMPRESSION: 1.  Suspicious 0.8 cm mass in the right breast at the 12 position. 2. Suspicious calcifications adjacent to the right breast mass spanning 1.1 cm. RECOMMENDATION: 1. Recommend ultrasound-guided biopsy of the mass in the right breast at the 12 o'clock position. 2. Recommend stereotactic guided biopsy of the calcifications in the upper slightly inner right breast. I have discussed the findings and  recommendations with the patient. If applicable, a reminder letter will be sent to the patient regarding the next appointment. BI-RADS CATEGORY  5: Highly suggestive of malignancy. Electronically Signed   By: Everlean Alstrom M.D.   On: 02/02/2021 11:49  MM CLIP PLACEMENT RIGHT  Result Date: 02/08/2021 CLINICAL DATA:  Status post ultrasound-guided biopsy of a RIGHT breast mass at the 12 o'clock axis followed by stereotactic-guided biopsy of suspicious calcifications within the upper inner quadrant of the RIGHT breast. EXAM: 3D DIAGNOSTIC RIGHT MAMMOGRAM POST ULTRASOUND AND STEREOTACTIC BIOPSIES COMPARISON:  Previous exam(s). FINDINGS: 3D Mammographic images were obtained following ultrasound guided biopsy of a suspicious mass in the RIGHT breast at the 12 o'clock axis followed by stereotactic-guided biopsy of suspicious calcifications within the adjacent upper inner quadrant of the RIGHT breast. Both biopsy marking clips are in expected position at the sites of biopsy. IMPRESSION: 1. Appropriate positioning of the ribbon shaped biopsy marking clip at the site of biopsy in the upper RIGHT breast corresponding to the targeted mass at the 12 o'clock axis. 2. Appropriate positioning of the X shaped biopsy marking clip at the site of biopsy in the upper inner quadrant of the RIGHT breast corresponding to the targeted calcifications. 3. Small-to-moderate post biopsy hematoma at the stereotactic biopsy site. Final Assessment: Post Procedure Mammograms for Marker Placement Electronically Signed   By: Franki Cabot M.D.   On: 02/08/2021 08:57  MM RT BREAST BX W LOC DEV 1ST LESION IMAGE BX SPEC STEREO GUIDE  Result Date: 02/08/2021 CLINICAL DATA:  Patient with suspicious calcifications in the upper inner quadrant of the RIGHT breast presents today for stereotactic biopsy using 3D tomosynthesis guidance. Separate ultrasound-guided biopsy was performed earlier today for a suspicious mass in the RIGHT breast at the 12  o'clock axis. EXAM: RIGHT BREAST STEREOTACTIC CORE NEEDLE BIOPSY COMPARISON:  Previous exams. FINDINGS: The patient and I discussed the procedure of  stereotactic-guided biopsy including benefits and alternatives. We discussed the high likelihood of a successful procedure. We discussed the risks of the procedure including infection, bleeding, tissue injury, clip migration, and inadequate sampling. Informed written consent was given. The usual time out protocol was performed immediately prior to the procedure. Using sterile technique and 1% Lidocaine as local anesthetic, under stereotactic guidance, a 9 gauge vacuum assisted device was used to perform core needle biopsy of calcifications in the upper inner quadrant of the RIGHT breast using a superior approach. Specimen radiograph was performed showing calcifications. Specimens with calcifications are identified for pathology. Lesion quadrant: Upper inner quadrant At the conclusion of the procedure, X shaped shaped tissue marker clip was deployed into the biopsy cavity. Follow-up 2-view mammogram was performed and dictated separately. IMPRESSION: Stereotactic-guided biopsy of suspicious calcifications within the upper inner quadrant of the RIGHT breast. Specimen sample labeled site 2. X shaped clip placed at the biopsy site. No apparent complications. Electronically Signed   By: Franki Cabot M.D.   On: 02/08/2021 08:54  [place] RT BREAST BX W LOC DEV 1ST LESION IMG BX SPEC US GUIDE  Addendum Date: 02/11/2021   ADDENDUM REPORT: 02/11/2021 16:28 ADDENDUM: Pathology revealed GRADE II INVASIVE DUCTAL CARCINOMA, DUCTAL CARCINOMA IN SITU, CALCIFICATIONS of the RIGHT breast, 12 o'clock, ribbon clip. This was found to be concordant by Dr. Franki Cabot. Pathology revealed FLAT EPITHELIAL ATYPIA WITH CALCIFICATIONS- LOBULAR NEOPLASIA (ATYPICAL LOBULAR HYPERPLASIA) of the RIGHT breast, upper inner quadrant, X clip. This was found to be discordant by Dr. Franki Cabot, with  excision recommended with the site of invasive ductal carcinoma at 12 o'clock (clips are adjacent). Pathology results were discussed with the patient by telephone. The patient reported doing well after the biopsies with tenderness at the sites. Post biopsy instructions and care were reviewed and questions were answered. The patient was encouraged to call [facility] for any additional concerns. The patient was referred to [facility] on February 20, 2021. Pathology results reported by Stacie Acres RN on 02/11/2021. Electronically Signed   By: Franki Cabot M.D.   On: 02/11/2021 16:28   Result Date: 02/11/2021 CLINICAL DATA:  Patient with a suspicious mass in the RIGHT breast presents today for ultrasound-guided core biopsy. Patient also with suspicious calcifications in the RIGHT breast for which stereotactic biopsy is scheduled later today. EXAM: ULTRASOUND GUIDED RIGHT BREAST CORE NEEDLE BIOPSY COMPARISON:  Previous exam(s). PROCEDURE: I met with the patient and we discussed the procedure of ultrasound-guided biopsy, including benefits and alternatives. We discussed the high likelihood of a successful procedure. We discussed the risks of the procedure, including infection, bleeding, tissue injury, clip migration, and inadequate sampling. Informed written consent was given. The usual time-out protocol was performed immediately prior to the procedure. Lesion quadrant: 12 o'clock Using sterile technique and 1% Lidocaine as local anesthetic, under direct ultrasound visualization, a 12 gauge spring-loaded device was used to perform biopsy of the RIGHT breast mass at the 12 o'clock axis, 3 cm from the nipple, using a lateral approach. At the conclusion of the procedure ribbon shaped tissue marker clip was deployed into the biopsy cavity. Patient also with suspicious calcifications in the RIGHT breast for which a  stereotactic biopsy will be performed later today. Follow up 2 view mammogram will be performed and dictated separately after the stereotactic biopsy for calcifications. IMPRESSION: 1. Ultrasound guided biopsy of the RIGHT breast mass at the 12  o'clock axis. Specimen container labeled SITE 1. Ribbon shaped clip placed at the biopsy site. No apparent complications. 2. Stereotactic biopsy for suspicious calcifications in the RIGHT breast will also be performed later today and dictated separately. Electronically Signed: By: Franki Cabot M.D. On: 02/08/2021 08:27    Orders Placed This Encounter  Procedures   DG Bone Density    Standing Status:   Future    Standing Expiration Date:   02/20/2022    Order Specific Question:   Reason for Exam (SYMPTOM  OR DIAGNOSIS REQUIRED)    Answer:   screening    Order Specific Question:   Preferred imaging location?    Answer:   [facility]    Order Specific Question:   Release to patient    Answer:   Immediate     All questions were answered. The patient knows to call the clinic with any problems, questions or concerns. The total time spent in the appointment was 60 minutes.     Truitt Merle, MD 02/20/2021 6:39 PM  I, Wilburn Mylar, am acting as scribe for Truitt Merle, MD.   I have reviewed the above documentation for accuracy and completeness, and I agree with the above.

## 2021-02-21 ENCOUNTER — Encounter: Payer: Self-pay | Admitting: *Deleted

## 2021-02-21 DIAGNOSIS — C50919 Malignant neoplasm of unspecified site of unspecified female breast: Secondary | ICD-10-CM | POA: Diagnosis not present

## 2021-02-21 DIAGNOSIS — N950 Postmenopausal bleeding: Secondary | ICD-10-CM | POA: Diagnosis not present

## 2021-02-27 DIAGNOSIS — Z170 Estrogen receptor positive status [ER+]: Secondary | ICD-10-CM | POA: Diagnosis not present

## 2021-02-27 DIAGNOSIS — C50411 Malignant neoplasm of upper-outer quadrant of right female breast: Secondary | ICD-10-CM | POA: Diagnosis not present

## 2021-02-28 ENCOUNTER — Telehealth: Payer: Self-pay | Admitting: *Deleted

## 2021-02-28 ENCOUNTER — Encounter: Payer: Self-pay | Admitting: *Deleted

## 2021-02-28 NOTE — Telephone Encounter (Signed)
Left vm regarding BMDC from 10.26.22. Contact information provided for questions or needs.

## 2021-03-01 DIAGNOSIS — N950 Postmenopausal bleeding: Secondary | ICD-10-CM | POA: Diagnosis not present

## 2021-03-01 DIAGNOSIS — N858 Other specified noninflammatory disorders of uterus: Secondary | ICD-10-CM | POA: Diagnosis not present

## 2021-03-04 DIAGNOSIS — M272 Inflammatory conditions of jaws: Secondary | ICD-10-CM | POA: Diagnosis not present

## 2021-03-05 ENCOUNTER — Telehealth: Payer: Self-pay | Admitting: Genetic Counselor

## 2021-03-05 ENCOUNTER — Ambulatory Visit: Payer: Self-pay | Admitting: Genetic Counselor

## 2021-03-05 DIAGNOSIS — Z1379 Encounter for other screening for genetic and chromosomal anomalies: Secondary | ICD-10-CM

## 2021-03-05 DIAGNOSIS — Z170 Estrogen receptor positive status [ER+]: Secondary | ICD-10-CM

## 2021-03-05 DIAGNOSIS — Z803 Family history of malignant neoplasm of breast: Secondary | ICD-10-CM

## 2021-03-05 DIAGNOSIS — C50411 Malignant neoplasm of upper-outer quadrant of right female breast: Secondary | ICD-10-CM

## 2021-03-05 DIAGNOSIS — Z8042 Family history of malignant neoplasm of prostate: Secondary | ICD-10-CM

## 2021-03-05 NOTE — Telephone Encounter (Signed)
Revealed negative genetic testing.  Discussed that we do not know why she has breast cancer or why there is cancer in the family. It could be sporadic/familial, due to a different gene that we are not testing, or maybe our current technology may not be able to pick something up.  It will be important for her to keep in contact with genetics to keep up with whether additional testing may be needed.

## 2021-03-05 NOTE — Progress Notes (Signed)
HPI:   Donna Mcconnell was previously seen in the [facility] due to a personal history of breast cancer, a family history of breast and prostate cancers, and concerns regarding a hereditary predisposition to cancer. Please refer to our prior cancer genetics clinic note for more information regarding our discussion, assessment and recommendations, at the time. Donna Mcconnell recent genetic test results were disclosed to her, as were recommendations warranted by these results. These results and recommendations are discussed in more detail below.  CANCER HISTORY:  Oncology History Overview Note  Cancer Staging Malignant neoplasm of upper-outer quadrant of right breast in female, estrogen receptor positive ([place]) Staging form: Breast, AJCC 8th Edition - Clinical stage from 02/08/2021: Stage IA (cT1b, cN0, cM0, G2, ER+, PR+, HER2-) - Signed by Truitt Merle, MD on 02/19/2021    Malignant neoplasm of upper-outer quadrant of right breast in female, estrogen receptor positive ([place])  02/02/2021 Mammogram   Right Diagnostic MM and Right Breast US  IMPRESSION: 1.  Suspicious 0.8 cm mass in the right breast at the 12 position. 2. Suspicious calcifications adjacent to the right breast mass spanning 1.1 cm.  No lymphadenopathy seen in right axilla.   02/08/2021 Cancer Staging   Staging form: Breast, AJCC 8th Edition - Clinical stage from 02/08/2021: Stage IA (cT1b, cN0, cM0, G2, ER+, PR+, HER2-) - Signed by Truitt Merle, MD on 02/19/2021 Stage prefix: Initial diagnosis Histologic grading system: 3 grade system    02/08/2021 Pathology Results   Diagnosis 1. Breast, right, needle core biopsy, 12 o'clock, ribbon shaped clip - INVASIVE DUCTAL CARCINOMA - DUCTAL CARCINOMA IN SITU - CALCIFICATIONS - SEE COMMENT 2. Breast, right, needle core biopsy, upper inner quadrant, x shaped clip - FLAT EPITHELIAL ATYPIA WITH CALCIFICATIONS - LOBULAR NEOPLASIA (ATYPICAL LOBULAR HYPERPLASIA) Microscopic  Comment 1. Based on the biopsy, the carcinoma appears Nottingham grade 1-2 of 3 and measures 0.6 cm in greatest linear extent.  1. PROGNOSTIC INDICATORS Results: The tumor cells are EQUIVOCAL for Her2 (2+). Her2 by FISH will be performed and results reported separately. Estrogen Receptor: >95%, POSITIVE, STRONG STAINING INTENSITY Progesterone Receptor: >95%, POSITIVE, STRONG STAINING INTENSITY Proliferation Marker Ki67: 1%  1. FLUORESCENCE IN-SITU HYBRIDIZATION Results: GROUP 5: HER2 **NEGATIVE**   02/15/2021 Initial Diagnosis   Malignant neoplasm of upper-outer quadrant of right breast in female, estrogen receptor positive ([place])   03/04/2021 Genetic Testing   Negative hereditary cancer genetic testing: no pathogenic variants detected in [facility] CustomNext-Cancer +RNAinsight Panel.  The report date is March 04, 2021.   The CustomNext-Cancer+RNAinsight panel offered by [facility] includes sequencing and rearrangement analysis for the following 47 genes:  APC, ATM, AXIN2, BARD1, BMPR1A, BRCA1, BRCA2, BRIP1, CDH1, CDK4, CDKN2A, CHEK2, DICER1, EPCAM, GREM1, HOXB13, MEN1, MLH1, MSH2, MSH3, MSH6, MUTYH, NBN, NF1, NF2, NTHL1, PALB2, PMS2, POLD1, POLE, PTEN, RAD51C, RAD51D, RECQL, RET, SDHA, SDHAF2, SDHB, SDHC, SDHD, SMAD4, SMARCA4, STK11, TP53, TSC1, TSC2, and VHL.  RNA data is routinely analyzed for use in variant interpretation for all genes.     FAMILY HISTORY:   Family History  Problem Relation Age of Onset   Cancer Father 61        unknown primary; mets   Prostate cancer Maternal Grandfather 75        metastatic   Cancer Paternal Grandfather 68        unknown type; ? lung   Breast cancer Other          MGF's mother; dx unknown age    We  obtained a detailed, 4-generation family history.  Significant diagnoses are listed below:   Donna Mcconnell is unaware of previous family history of genetic testing for hereditary cancer risks. here is no reported Ashkenazi Jewish ancestry. There is  no known consanguinity.   GENETIC TEST RESULTS:  The [facility] CustomNext-Cancer +RNAinsight Panel found no pathogenic mutations. The CustomNext-Cancer+RNAinsight panel offered by [facility] includes sequencing and rearrangement analysis for the following 47 genes:  APC, ATM, AXIN2, BARD1, BMPR1A, BRCA1, BRCA2, BRIP1, CDH1, CDK4, CDKN2A, CHEK2, DICER1, EPCAM, GREM1, HOXB13, MEN1, MLH1, MSH2, MSH3, MSH6, MUTYH, NBN, NF1, NF2, NTHL1, PALB2, PMS2, POLD1, POLE, PTEN, RAD51C, RAD51D, RECQL, RET, SDHA, SDHAF2, SDHB, SDHC, SDHD, SMAD4, SMARCA4, STK11, TP53, TSC1, TSC2, and VHL.  RNA data is routinely analyzed for use in variant interpretation for all genes.  The test report has been scanned into EPIC and is located under the Molecular Pathology section of the Results Review tab.  A portion of the result report is included below for reference. Genetic testing reported out on March 04, 2021.     Even though a pathogenic variant was not identified, possible explanations for the cancer in the family may include: There may be no hereditary risk for cancer in the family. The cancers in Donna Mcconnell and/or her family may be sporadic/familial or due to other genetic and environmental factors. There may be a gene mutation in one of these genes that current testing methods cannot detect but that chance is small. There could be another gene that has not yet been discovered, or that we have not yet tested, that is responsible for the cancer diagnoses in the family.  It is also possible there is a hereditary cause for the cancer in the family that Donna Mcconnell did not inherit.   Therefore, it is important to remain in touch with cancer genetics in the future so that we can continue to offer Donna Mcconnell the most up to date genetic testing.     ADDITIONAL GENETIC TESTING:  We discussed with Donna Mcconnell that her genetic testing was fairly extensive.  If there are other relevant genes identified to increase cancer risk that  can be analyzed in the future, we would be happy to discuss and coordinate this testing at that time.    CANCER SCREENING RECOMMENDATIONS:  Donna Mcconnell test result is considered negative (normal).  This means that we have not identified a hereditary cause for her personal history of can at this time.   An individual's cancer risk and medical management are not determined by genetic test results alone. Overall cancer risk assessment incorporates additional factors, including personal medical history, family history, and any available genetic information that may result in a personalized plan for cancer prevention and surveillance. Therefore, it is recommended she continue to follow the cancer management and screening guidelines provided by her oncology and primary healthcare provider.  RECOMMENDATIONS FOR FAMILY MEMBERS:   Since she did not inherit a identifiable mutation in a cancer predisposition gene included on this panel, her children could not have inherited a known mutation from her in one of these genes. Individuals in this family might be at some increased risk of developing cancer, over the general population risk, due to the family history of cancer.  Individuals in the family should notify their providers of the family history of cancer. We recommend women in this family have a yearly mammogram beginning at age 41, or 63 years younger than the earliest onset of cancer, an annual clinical breast  exam, and perform monthly breast self-exams.     FOLLOW-UP:  Lastly, we discussed with Ms. Peckham that cancer genetics is a rapidly advancing field and it is possible that new genetic tests will be appropriate for her and/or her family members in the future. We encouraged her to remain in contact with cancer genetics on an annual basis so we can update her personal and family histories and let her know of advances in cancer genetics that may benefit this family.   Our contact number was provided. Ms.  Vasey questions were answered to her satisfaction, and she knows she is welcome to call us at anytime with additional questions or concerns.   Donna Mcconnell, [place], [facility] Genetic Counselor [email or web address] (P) [phone]

## 2021-03-06 DIAGNOSIS — C50411 Malignant neoplasm of upper-outer quadrant of right female breast: Secondary | ICD-10-CM | POA: Diagnosis not present

## 2021-03-06 DIAGNOSIS — C50911 Malignant neoplasm of unspecified site of right female breast: Secondary | ICD-10-CM | POA: Diagnosis not present

## 2021-03-06 DIAGNOSIS — Z170 Estrogen receptor positive status [ER+]: Secondary | ICD-10-CM | POA: Diagnosis not present

## 2021-03-06 DIAGNOSIS — C50811 Malignant neoplasm of overlapping sites of right female breast: Secondary | ICD-10-CM | POA: Diagnosis not present

## 2021-03-07 ENCOUNTER — Encounter: Payer: Self-pay | Admitting: *Deleted

## 2021-03-07 ENCOUNTER — Telehealth: Payer: Self-pay | Admitting: *Deleted

## 2021-03-07 ENCOUNTER — Other Ambulatory Visit: Payer: Self-pay | Admitting: General Surgery

## 2021-03-07 DIAGNOSIS — Z170 Estrogen receptor positive status [ER+]: Secondary | ICD-10-CM

## 2021-03-07 NOTE — Telephone Encounter (Signed)
Pt called with sx decision and treatment location. Pt would like to have tx at [facility] and move forward with lumpectomy and reduction. Physician team notified.  Informed pt next step will be sx and she will receive a call from Dr.Wakefield's office with a sx date and instructions. Received verbal understanding.

## 2021-03-11 ENCOUNTER — Encounter: Payer: Self-pay | Admitting: *Deleted

## 2021-03-11 ENCOUNTER — Other Ambulatory Visit: Payer: Self-pay | Admitting: General Surgery

## 2021-03-11 DIAGNOSIS — R928 Other abnormal and inconclusive findings on diagnostic imaging of breast: Secondary | ICD-10-CM

## 2021-03-12 DIAGNOSIS — C50911 Malignant neoplasm of unspecified site of right female breast: Secondary | ICD-10-CM | POA: Diagnosis not present

## 2021-03-14 ENCOUNTER — Other Ambulatory Visit: Payer: Self-pay

## 2021-03-14 ENCOUNTER — Ambulatory Visit
Admission: RE | Admit: 2021-03-14 | Discharge: 2021-03-14 | Disposition: A | Discharge status: Home / Self Care | Payer: BC Managed Care – PPO | Source: Ambulatory Visit | Attending: Hematology | Admitting: Hematology

## 2021-03-14 DIAGNOSIS — Z780 Asymptomatic menopausal state: Secondary | ICD-10-CM | POA: Diagnosis not present

## 2021-03-14 DIAGNOSIS — E2839 Other primary ovarian failure: Secondary | ICD-10-CM

## 2021-03-18 ENCOUNTER — Telehealth: Payer: Self-pay | Admitting: *Deleted

## 2021-03-18 DIAGNOSIS — M272 Inflammatory conditions of jaws: Secondary | ICD-10-CM | POA: Diagnosis not present

## 2021-03-18 DIAGNOSIS — D165 Benign neoplasm of lower jaw bone: Secondary | ICD-10-CM | POA: Diagnosis not present

## 2021-03-18 DIAGNOSIS — T8131XA Disruption of external operation (surgical) wound, not elsewhere classified, initial encounter: Secondary | ICD-10-CM | POA: Diagnosis not present

## 2021-03-18 NOTE — Telephone Encounter (Signed)
-----   Message from Truitt Merle, MD sent at 03/17/2021 12:08 PM EST ----- Please let pt know her results, encourage her to start calcium and vitd if she is not on, and weight bearing exercise. I will discuss medicine to strength her bone on next visit.   Truitt Merle  03/17/2021

## 2021-03-18 NOTE — Telephone Encounter (Signed)
Per Dr.Feng, called pt with message below. Advised to call for any concerns and to f/u as scheduled. Pt verbalized understanding.

## 2021-03-27 DIAGNOSIS — M272 Inflammatory conditions of jaws: Secondary | ICD-10-CM | POA: Diagnosis not present

## 2021-03-27 DIAGNOSIS — T8131XD Disruption of external operation (surgical) wound, not elsewhere classified, subsequent encounter: Secondary | ICD-10-CM | POA: Diagnosis not present

## 2021-03-27 NOTE — Pre-Procedure Instructions (Signed)
Surgical Instructions    Your procedure is scheduled on Thursday 04/04/21.   Report to [facility] Main Entrance "A" at 05:30 A.M., then check in with the Admitting office.  Call this number if you have problems the morning of surgery:  [phone]   If you have any questions prior to your surgery date call [phone]: Open Monday-Friday 8am-4pm    Remember:  Do not eat after midnight the night before your surgery  You may drink clear liquids until 04:30 A.M. the morning of your surgery.   Clear liquids allowed are: Water, Non-Citrus Juices (without pulp), Carbonated Beverages, Clear Tea, Black Coffee ONLY (NO MILK, CREAM OR POWDERED CREAMER of any kind), and Gatorade  Patient Instructions  The night before surgery:  No food after midnight. ONLY clear liquids after midnight  The day of surgery (if you do NOT have diabetes):  Drink ONE (1) Pre-Surgery Clear Ensure by 04:30 a.m. the morning of surgery. Drink in one sitting. Do not sip.  This drink was given to you during your hospital  pre-op appointment visit.  Nothing else to drink after completing the  Pre-Surgery Clear Ensure.         If you have questions, please contact your surgeon's office.     Take these medicines the morning of surgery with A SIP OF WATER   pantoprazole (PROTONIX)   As of today, STOP taking any Aspirin (unless otherwise instructed by your surgeon) Aleve, Naproxen, Ibuprofen, Motrin, Advil, Goody's, BC's, all herbal medications, fish oil, and all vitamins.     After your COVID test   You are not required to quarantine however you are required to wear a well-fitting mask when you are out and around people not in your household.  If your mask becomes wet or soiled, replace with a new one.  Wash your hands often with soap and water for 20 seconds or clean your hands with an alcohol-based hand sanitizer that contains at least 60% alcohol.  Do not share personal items.  Notify your provider: if  you are in close contact with someone who has COVID  or if you develop a fever of 100.4 or greater, sneezing, cough, sore throat, shortness of breath or body aches.             Do not wear jewelry or makeup Do not wear lotions, powders, perfumes/colognes, or deodorant. Do not shave 48 hours prior to surgery.  Men may shave face and neck. Do not bring valuables to the hospital. DO Not wear nail polish, gel polish, artificial nails, or any other type of covering on natural nails including finger and toenails. If patients have artificial nails, gel coating, etc. that need to be removed by a nail salon, please have this removed prior to surgery or surgery may need to be canceled/delayed if the surgeon/ anesthesia feels like the patient is unable to be adequately monitored.             [facility] is not responsible for any belongings or valuables.  Do NOT Smoke (Tobacco/Vaping)  24 hours prior to your procedure  If you use a CPAP at night, you may bring your mask for your overnight stay.   Contacts, glasses, hearing aids, dentures or partials may not be worn into surgery, please bring cases for these belongings   For patients admitted to the hospital, discharge time will be determined by your treatment team.   Patients discharged the day of surgery will not be allowed to drive  home, and someone needs to stay with them for 24 hours.  NO VISITORS WILL BE ALLOWED IN PRE-OP WHERE PATIENTS ARE PREPPED FOR SURGERY.  ONLY 1 SUPPORT PERSON MAY BE PRESENT IN THE WAITING ROOM WHILE YOU ARE IN SURGERY.  IF YOU ARE TO BE ADMITTED, ONCE YOU ARE IN YOUR ROOM YOU WILL BE ALLOWED TWO (2) VISITORS. 1 (ONE) VISITOR MAY STAY OVERNIGHT BUT MUST ARRIVE TO THE ROOM BY 8pm.  Minor children may have two parents present. Special consideration for safety and communication needs will be reviewed on a case by case basis.  Special instructions:    Oral Hygiene is also important to reduce your risk of infection.   Remember - BRUSH YOUR TEETH THE MORNING OF SURGERY WITH YOUR REGULAR TOOTHPASTE   [facility]- Preparing For Surgery  Before surgery, you can play an important role. Because skin is not sterile, your skin needs to be as free of germs as possible. You can reduce the number of germs on your skin by washing with CHG (chlorahexidine gluconate) Soap before surgery.  CHG is an antiseptic cleaner which kills germs and bonds with the skin to continue killing germs even after washing.     Please do not use if you have an allergy to CHG or antibacterial soaps. If your skin becomes reddened/irritated stop using the CHG.  Do not shave (including legs and underarms) for at least 48 hours prior to first CHG shower. It is OK to shave your face.  Please follow these instructions carefully.     Shower the NIGHT BEFORE SURGERY and the MORNING OF SURGERY with CHG Soap.   If you chose to wash your hair, wash your hair first as usual with your normal shampoo. After you shampoo, rinse your hair and body thoroughly to remove the shampoo.  Then [employer] and genitals (private parts) with your normal soap and rinse thoroughly to remove soap.  After that Use CHG Soap as you would any other liquid soap. You can apply CHG directly to the skin and wash gently with a scrungie or a clean washcloth.   Apply the CHG Soap to your body ONLY FROM THE NECK DOWN.  Do not use on open wounds or open sores. Avoid contact with your eyes, ears, mouth and genitals (private parts). Wash Face and genitals (private parts)  with your normal soap.   Wash thoroughly, paying special attention to the area where your surgery will be performed.  Thoroughly rinse your body with warm water from the neck down.  DO NOT shower/wash with your normal soap after using and rinsing off the CHG Soap.  Pat yourself dry with a CLEAN TOWEL.  Wear CLEAN PAJAMAS to bed the night before surgery  Place CLEAN SHEETS on your bed the night before your  surgery  DO NOT SLEEP WITH PETS.   Day of Surgery:  Take a shower with CHG soap. Wear Clean/Comfortable clothing the morning of surgery Do not apply any deodorants/lotions.   Remember to brush your teeth WITH YOUR REGULAR TOOTHPASTE.   Please read over the following fact sheets that you were given.

## 2021-03-28 ENCOUNTER — Encounter (HOSPITAL_COMMUNITY)
Admission: RE | Admit: 2021-03-28 | Discharge: 2021-03-28 | Disposition: A | Discharge status: Home / Self Care | Payer: BC Managed Care – PPO | Source: Ambulatory Visit | Attending: General Surgery | Admitting: General Surgery

## 2021-03-28 ENCOUNTER — Encounter (HOSPITAL_COMMUNITY): Payer: Self-pay

## 2021-03-28 ENCOUNTER — Other Ambulatory Visit: Payer: Self-pay

## 2021-03-28 VITALS — BP 160/80 | HR 100 | Temp 98.4°F | Resp 17 | Ht 61.5 in | Wt 188.0 lb

## 2021-03-28 DIAGNOSIS — Z01818 Encounter for other preprocedural examination: Secondary | ICD-10-CM | POA: Insufficient documentation

## 2021-03-28 DIAGNOSIS — I10 Essential (primary) hypertension: Secondary | ICD-10-CM | POA: Insufficient documentation

## 2021-03-28 DIAGNOSIS — C50911 Malignant neoplasm of unspecified site of right female breast: Secondary | ICD-10-CM | POA: Diagnosis not present

## 2021-03-28 DIAGNOSIS — K7581 Nonalcoholic steatohepatitis (NASH): Secondary | ICD-10-CM | POA: Insufficient documentation

## 2021-03-28 DIAGNOSIS — Z170 Estrogen receptor positive status [ER+]: Secondary | ICD-10-CM | POA: Diagnosis not present

## 2021-03-28 DIAGNOSIS — I2510 Atherosclerotic heart disease of native coronary artery without angina pectoris: Secondary | ICD-10-CM | POA: Insufficient documentation

## 2021-03-28 DIAGNOSIS — Z01811 Encounter for preprocedural respiratory examination: Secondary | ICD-10-CM | POA: Insufficient documentation

## 2021-03-28 DIAGNOSIS — C50411 Malignant neoplasm of upper-outer quadrant of right female breast: Secondary | ICD-10-CM | POA: Diagnosis not present

## 2021-03-28 DIAGNOSIS — K769 Liver disease, unspecified: Secondary | ICD-10-CM

## 2021-03-28 HISTORY — DX: Other complications of anesthesia, initial encounter: T88.59XA

## 2021-03-28 HISTORY — DX: Other specified postprocedural states: Z98.890

## 2021-03-28 HISTORY — PX: MOUTH SURGERY: SHX715

## 2021-03-28 HISTORY — DX: Benign neoplasm of lower jaw bone: D16.5

## 2021-03-28 HISTORY — DX: Other specified postprocedural states: R11.2

## 2021-03-28 LAB — COMPREHENSIVE METABOLIC PANEL
ALT: 21 U/L (ref 0–44)
AST: 38 U/L (ref 15–41)
Albumin: 3.9 g/dL (ref 3.5–5.0)
Alkaline Phosphatase: 75 U/L (ref 38–126)
Anion gap: 9 (ref 5–15)
BUN: 14 mg/dL (ref 8–23)
CO2: 27 mmol/L (ref 22–32)
Calcium: 9.4 mg/dL (ref 8.9–10.3)
Chloride: 103 mmol/L (ref 98–111)
Creatinine, Ser: 0.77 mg/dL (ref 0.44–1.00)
GFR, Estimated: >60 mL/min (ref >60)
Glucose, Bld: 114 mg/dL — ABNORMAL HIGH (ref 70–99)
Potassium: 3.9 mmol/L (ref 3.5–5.1)
Sodium: 139 mmol/L (ref 135–145)
Total Bilirubin: 0.7 mg/dL (ref 0.3–1.2)
Total Protein: 7.2 g/dL (ref 6.5–8.1)

## 2021-03-28 LAB — CBC
HCT: 44.2 % (ref 36.0–46.0)
Hemoglobin: 13.9 g/dL (ref 12.0–15.0)
MCH: 30.2 pg (ref 26.0–34.0)
MCHC: 31.4 g/dL (ref 30.0–36.0)
MCV: 96.1 fL (ref 80.0–100.0)
Platelets: 310 10*3/uL (ref 150–400)
RBC: 4.6 MIL/uL (ref 3.87–5.11)
RDW: 12.7 % (ref 11.5–15.5)
WBC: 5.6 10*3/uL (ref 4.0–10.5)
nRBC: 0 % (ref 0.0–0.2)

## 2021-03-28 NOTE — Progress Notes (Signed)
PCP - Dr. Shon Baton Cardiologist - pt denies  PPM/ICD - n/a Chest x-ray - n/a EKG - 03/28/21 in PAT Stress Test - reports that she had a normal stress test over 10-15 years ago in [place] but does not remember where ECHO - pt denies Cardiac Cath - pt denies  Sleep Study - pt denies CPAP - n/a  Fasting Blood Sugar - n/a  Blood Thinner Instructions: n/a Aspirin Instructions: As of today, STOP taking any Aspirin (unless otherwise instructed by your surgeon) Aleve, Naproxen, Ibuprofen, Motrin, Advil, Goody's, BC's, all herbal medications, fish oil, and all vitamins.  ERAS Protcol - yes, clears until 0430 PRE-SURGERY Ensure or G2- ensure  COVID TEST- n/a; ambulatory surgery  Anesthesia review: yes, seed lumpectomy, abnormal EKG  Patient denies shortness of breath, fever, cough and chest pain at PAT appointment   All instructions explained to the patient, with a verbal understanding of the material. Patient agrees to go over the instructions while at home for a better understanding. Patient also instructed to self quarantine after being tested for COVID-19. The opportunity to ask questions was provided.

## 2021-03-29 NOTE — Anesthesia Preprocedure Evaluation (Addendum)
Anesthesia Evaluation  Patient identified by MRN, date of birth, ID band Patient awake    Reviewed: Allergy & Precautions, H&P , NPO status , Patient's Chart, lab work & pertinent test results  History of Anesthesia Complications (+) PONV and history of anesthetic complications  Airway Mallampati: II   Neck ROM: full    Dental   Pulmonary Recent URI ,    breath sounds clear to auscultation       Cardiovascular hypertension,  Rhythm:regular Rate:Normal     Neuro/Psych    GI/Hepatic GERD  ,  Endo/Other    Renal/GU      Musculoskeletal  (+) Arthritis ,   Abdominal   Peds  Hematology   Anesthesia Other Findings   Reproductive/Obstetrics                            Anesthesia Physical Anesthesia Plan  ASA: 2  Anesthesia Plan: General   Post-op Pain Management: Regional block   Induction: Intravenous  PONV Risk Score and Plan: 4 or greater and Ondansetron, Dexamethasone, Midazolam and Treatment may vary due to age or medical condition  Airway Management Planned: Oral ETT  Additional Equipment:   Intra-op Plan:   Post-operative Plan: Extubation in OR  Informed Consent: I have reviewed the patients History and Physical, chart, labs and discussed the procedure including the risks, benefits and alternatives for the proposed anesthesia with the patient or authorized representative who has indicated his/her understanding and acceptance.     Dental advisory given  Plan Discussed with: CRNA, Anesthesiologist and Surgeon  Anesthesia Plan Comments: (See APP note by Durel Salts, FNP . See additional anesthesia APP note by Myra Gianotti, PA-C. 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 Records requested from oral surgeon Dr. Hardie Shackleton. Patient reported he requested that airway device/ETT be secured  to the right, away from her bone graft site.   UPDATE: Letter received from Dr. Hardie Shackleton,  "For [facility]'s procedure please Intubate/extubate with anesthesiologist's choice of endotracheal tube, LMA.  When using laryngoscope please use caution to avoid contact with edentulous mandibular ridge.  This area in the front of her lower jaw is obvious as it has no teeth.  I recently had to do a soft tissue flap over bone graft and the area is not very "mature".  Please tape endotracheal tube to right commissure her mouth.  I believe that will help keep the edentulous ridge protected during the procedure.  Thank you for your consideration.")     Anesthesia Quick Evaluation

## 2021-03-29 NOTE — Progress Notes (Signed)
Anesthesia Chart Review:   Case: [id number] Date/Time: 04/04/21 0715   Procedure: RIGHT BREAST LUMPECTOMY WITH RADIOACTIVE SEED AND AXILLARY SENTINEL LYMPH NODE BIOPSY (Right: Breast) - 62 MINUTES ROOM 1   Anesthesia type: General   Pre-op diagnosis: RIGHT BREAST CANCER   Location: [facility] OR ROOM 01 / [place] OR   Surgeons: Rolm Bookbinder, MD       DISCUSSION: Pt is 60 years old with hx coronary atherosclerosis, HTN, NASH  VS: BP (!) 160/80   Pulse 100   Temp 36.9 C (Oral)   Resp 17   Ht 5' 1.5" (1.562 m)   Wt 85.3 kg   SpO2 100%   BMI 34.95 kg/m   PROVIDERS: - PCP is Shon Baton, MD   LABS: Labs reviewed: Acceptable for surgery. (all labs ordered are listed, but only abnormal results are displayed)  Labs Reviewed  COMPREHENSIVE METABOLIC PANEL - Abnormal; Notable for the following components:      Result Value   Glucose, Bld 114 (*)    All other components within normal limits  CBC     IMAGES: CT cardiac scoring 12/13/18:  1. Total Agatston score of 10.4, corresponding to 74th percentile for age, sex, and race based cohort. 2. Hepatic steatosis. 3. Moderate hiatal hernia. - Aortic Atherosclerosis    EKG 03/28/21:  NSR. Possible Left atrial enlargement. Cannot rule out Anterior infarct, age undetermined   CV:  N/A  Past Medical History:  Diagnosis Date   Allergy    shellfish   Breast cancer ([place])    Complication of anesthesia    Coronary atherosclerosis    Fatty liver disease, nonalcoholic    Food allergy    GERD (gastroesophageal reflux disease)    Hyperlipidemia    Hypertension    PONV (postoperative nausea and vomiting)    Spondylosis     Past Surgical History:  Procedure Laterality Date   ADENOIDECTOMY     cercalage     COLONOSCOPY     10 yrs ago   TONSILLECTOMY     TYMPANOSTOMY TUBE PLACEMENT      MEDICATIONS:  ASTAXANTHIN PO   chlorhexidine (PERIDEX) 0.12 % solution   ibuprofen (ADVIL) 200 MG tablet   Multiple Vitamin (MULTIVITAMIN  WITH MINERALS) TABS tablet   pantoprazole (PROTONIX) 40 MG tablet   Sodium Sulfate-Mag Sulfate-KCl (SUTAB) [phone] MG TABS   valsartan (DIOVAN) 80 MG tablet   venlafaxine XR (EFFEXOR XR) 37.5 MG 24 hr capsule   No current facility-administered medications for this encounter.    If no changes, I anticipate pt can proceed with surgery as scheduled.   Willeen Cass, PhD, FNP-BC [facility] Short Stay Surgical Center/Anesthesiology Phone: [phone] 03/29/2021 11:53 AM

## 2021-04-02 ENCOUNTER — Encounter (HOSPITAL_COMMUNITY): Payer: Self-pay

## 2021-04-02 NOTE — Progress Notes (Signed)
Anesthesia follow-up: See Anesthesia APP note by Willeen Cass, NP.   Patient is for right breast lumpectomy for right breast cancer on 04/04/21 by Dr. Donne Hazel and is scheduled for RSL implant on 04/03/21 at 1:30 PM.   I called her on 04/02/21 to follow-up regarding oral surgeon visit. 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 She then had a bone graft in July 2022. After four screws were removed the bone graft site had some dehiscence without signs of infection. She had attempted suture closure following her breast cancer diagnosis. At 12/1 22 evaluation by plastic surgeon Irene Limbo, MD to discuss right breast reconstruction, she noted patient still had some exposed mandibular bone graft. She discussed that "intra oral procedure can be disrupted with airway placement from each surgery, any intra oral procedure can seed bacteria systemically and would hope to avoid any oral procedures for 6 weeks post op. Spoke with Dr. Hardie Shackleton, will also update Dr. Donne Hazel."  Patient said that she saw Dr. Hardie Shackleton on 03/28/21 and sutures placed to close opened graft site. She notes that he is aware of plans for lumpectomy on 04/04/21 and breat reconstruction on 04/15/21. She saw him for follow-up on 04/01/21 and reported graft site looks good. She said that Dr. Hardie Shackleton requested ETT/airway device be secured on the right side since her surgery involved the left lower mandibular region.   Also, she notes that on 03/29/21 she had a mild sore throat which she felt was related to her 03/28/21 oral surgery procedure. This went away, but on 03/31/21 she developed a runny nose and dry cough. Her voice is mildly hoarse as of 04/02/21, but says her runny nose has stopped and has not had any consistent coughing (no coughing or conversational dyspnea noted  during phone call). She feels well overall. No known exposure to COVID or flu. She has not had any fever or wheezing. Denied SOB. She denied chest pain. She typically walks at least a mile around her neighborhood 3 times/week without CV symptoms. She denied any limitations mouth opening.  I updated anesthesiologist Albertha Ghee, MD as well has CCS triage nurse at Dr. Cristal Generous office. I also contacted Dr. Hardie Shackleton office to request any pertinent records or additional recommendations from an oral surgery standpoint. Patient had mild rhinorrhea that is improved today (04/02/21). Dry cough also improved. Currently, no significant URI symptoms; however, if she develops new or worsening symptoms prior to RSL placement or surgery then she understands she will need to let anesthesia APP and/or Dr. Donne Hazel know as this could affect timing of procedure(s).   Myra Gianotti, PA-C Surgical Short Stay/Anesthesiology [facility] Phone [phone] [facility] Phone [phone] 04/02/2021 11:48 AM

## 2021-04-03 ENCOUNTER — Ambulatory Visit
Admission: RE | Admit: 2021-04-03 | Discharge: 2021-04-03 | Disposition: A | Discharge status: Home / Self Care | Payer: BC Managed Care – PPO | Source: Ambulatory Visit | Attending: General Surgery | Admitting: General Surgery

## 2021-04-03 DIAGNOSIS — C50911 Malignant neoplasm of unspecified site of right female breast: Secondary | ICD-10-CM | POA: Diagnosis not present

## 2021-04-03 DIAGNOSIS — C50411 Malignant neoplasm of upper-outer quadrant of right female breast: Secondary | ICD-10-CM

## 2021-04-03 DIAGNOSIS — Z170 Estrogen receptor positive status [ER+]: Secondary | ICD-10-CM

## 2021-04-04 ENCOUNTER — Encounter (HOSPITAL_COMMUNITY): Payer: Self-pay | Admitting: General Surgery

## 2021-04-04 ENCOUNTER — Other Ambulatory Visit: Payer: Self-pay

## 2021-04-04 ENCOUNTER — Encounter (HOSPITAL_COMMUNITY)
Admission: RE | Disposition: A | Discharge status: Home / Self Care | Payer: Self-pay | Source: Ambulatory Visit | Attending: General Surgery

## 2021-04-04 ENCOUNTER — Ambulatory Visit (HOSPITAL_COMMUNITY): Payer: BC Managed Care – PPO | Admitting: Vascular Surgery

## 2021-04-04 ENCOUNTER — Ambulatory Visit (HOSPITAL_COMMUNITY)
Admission: RE | Admit: 2021-04-04 | Discharge: 2021-04-04 | Disposition: A | Discharge status: Home / Self Care | Payer: BC Managed Care – PPO | Source: Ambulatory Visit | Attending: General Surgery | Admitting: General Surgery

## 2021-04-04 ENCOUNTER — Ambulatory Visit (HOSPITAL_COMMUNITY): Payer: BC Managed Care – PPO | Admitting: Physician Assistant

## 2021-04-04 ENCOUNTER — Ambulatory Visit
Admission: RE | Admit: 2021-04-04 | Discharge: 2021-04-04 | Disposition: A | Discharge status: Home / Self Care | Payer: BC Managed Care – PPO | Source: Ambulatory Visit | Attending: General Surgery | Admitting: General Surgery

## 2021-04-04 DIAGNOSIS — C50411 Malignant neoplasm of upper-outer quadrant of right female breast: Secondary | ICD-10-CM | POA: Diagnosis not present

## 2021-04-04 DIAGNOSIS — C50911 Malignant neoplasm of unspecified site of right female breast: Secondary | ICD-10-CM | POA: Diagnosis not present

## 2021-04-04 DIAGNOSIS — R928 Other abnormal and inconclusive findings on diagnostic imaging of breast: Secondary | ICD-10-CM

## 2021-04-04 DIAGNOSIS — N6091 Unspecified benign mammary dysplasia of right breast: Secondary | ICD-10-CM | POA: Diagnosis not present

## 2021-04-04 DIAGNOSIS — M1990 Unspecified osteoarthritis, unspecified site: Secondary | ICD-10-CM | POA: Diagnosis not present

## 2021-04-04 DIAGNOSIS — Z9189 Other specified personal risk factors, not elsewhere classified: Secondary | ICD-10-CM | POA: Diagnosis not present

## 2021-04-04 DIAGNOSIS — K219 Gastro-esophageal reflux disease without esophagitis: Secondary | ICD-10-CM | POA: Diagnosis not present

## 2021-04-04 DIAGNOSIS — Z170 Estrogen receptor positive status [ER+]: Secondary | ICD-10-CM | POA: Diagnosis not present

## 2021-04-04 DIAGNOSIS — I10 Essential (primary) hypertension: Secondary | ICD-10-CM | POA: Diagnosis not present

## 2021-04-04 DIAGNOSIS — G8918 Other acute postprocedural pain: Secondary | ICD-10-CM | POA: Diagnosis not present

## 2021-04-04 HISTORY — PX: BREAST LUMPECTOMY WITH RADIOACTIVE SEED AND SENTINEL LYMPH NODE BIOPSY: SHX6550

## 2021-04-04 SURGERY — BREAST LUMPECTOMY WITH RADIOACTIVE SEED AND SENTINEL LYMPH NODE BIOPSY
Anesthesia: General | Site: Breast | Laterality: Right

## 2021-04-04 MED ORDER — BUPIVACAINE-EPINEPHRINE 0.25% -1:200000 IJ SOLN
INTRAMUSCULAR | Status: DC | PRN
  Administered 2021-04-04: 6 mL

## 2021-04-04 MED ORDER — PROPOFOL 10 MG/ML IV BOLUS
INTRAVENOUS | Status: AC
  Filled 2021-04-04: qty 20

## 2021-04-04 MED ORDER — PHENYLEPHRINE 40 MCG/ML (10ML) SYRINGE FOR IV PUSH (FOR BLOOD PRESSURE SUPPORT)
PREFILLED_SYRINGE | INTRAVENOUS | Status: DC | PRN
  Administered 2021-04-04 (×5): 80 ug via INTRAVENOUS

## 2021-04-04 MED ORDER — CHLORHEXIDINE GLUCONATE CLOTH 2 % EX PADS: 6.0000 | MEDICATED_PAD | Freq: Once | CUTANEOUS | Status: DC

## 2021-04-04 MED ORDER — MIDAZOLAM HCL 2 MG/2ML IJ SOLN
INTRAMUSCULAR | Status: AC
  Filled 2021-04-04: qty 2

## 2021-04-04 MED ORDER — DEXAMETHASONE SODIUM PHOSPHATE 10 MG/ML IJ SOLN
INTRAMUSCULAR | Status: AC
  Filled 2021-04-04: qty 1

## 2021-04-04 MED ORDER — BUPIVACAINE-EPINEPHRINE (PF) 0.25% -1:200000 IJ SOLN
INTRAMUSCULAR | Status: AC
  Filled 2021-04-04: qty 30

## 2021-04-04 MED ORDER — ROCURONIUM BROMIDE 10 MG/ML (PF) SYRINGE
PREFILLED_SYRINGE | INTRAVENOUS | Status: AC
  Filled 2021-04-04: qty 10

## 2021-04-04 MED ORDER — DEXAMETHASONE SODIUM PHOSPHATE 10 MG/ML IJ SOLN
INTRAMUSCULAR | Status: DC | PRN
  Administered 2021-04-04: 5 mg via INTRAVENOUS

## 2021-04-04 MED ORDER — LIDOCAINE 2% (20 MG/ML) 5 ML SYRINGE
INTRAMUSCULAR | Status: DC | PRN
  Administered 2021-04-04: 60 mg via INTRAVENOUS

## 2021-04-04 MED ORDER — FENTANYL CITRATE (PF) 250 MCG/5ML IJ SOLN
INTRAMUSCULAR | Status: DC | PRN
  Administered 2021-04-04 (×3): 50 ug via INTRAVENOUS

## 2021-04-04 MED ORDER — OXYCODONE HCL 5 MG PO TABS: 5.0000 mg | ORAL_TABLET | Freq: Four times a day (QID) | ORAL | 0 refills | Status: DC | PRN

## 2021-04-04 MED ORDER — ENSURE PRE-SURGERY PO LIQD: 296.0000 mL | Freq: Once | ORAL | Status: DC

## 2021-04-04 MED ORDER — 0.9 % SODIUM CHLORIDE (POUR BTL) OPTIME
TOPICAL | Status: DC | PRN
  Administered 2021-04-04: 1000 mL

## 2021-04-04 MED ORDER — CHLORHEXIDINE GLUCONATE 0.12 % MT SOLN
15.0000 mL | Freq: Once | OROMUCOSAL | Status: AC
  Administered 2021-04-04: 15 mL via OROMUCOSAL
  Filled 2021-04-04: qty 15

## 2021-04-04 MED ORDER — LIDOCAINE 2% (20 MG/ML) 5 ML SYRINGE
INTRAMUSCULAR | Status: AC
  Filled 2021-04-04: qty 5

## 2021-04-04 MED ORDER — OXYCODONE HCL 5 MG PO TABS
ORAL_TABLET | ORAL | Status: AC
  Filled 2021-04-04: qty 1

## 2021-04-04 MED ORDER — MIDAZOLAM HCL 5 MG/5ML IJ SOLN
INTRAMUSCULAR | Status: DC | PRN
  Administered 2021-04-04: 2 mg via INTRAVENOUS

## 2021-04-04 MED ORDER — ONDANSETRON HCL 4 MG/2ML IJ SOLN
INTRAMUSCULAR | Status: AC
  Filled 2021-04-04: qty 2

## 2021-04-04 MED ORDER — OXYCODONE HCL 5 MG/5ML PO SOLN: 5.0000 mg | Freq: Once | ORAL | Status: AC | PRN

## 2021-04-04 MED ORDER — EPHEDRINE SULFATE-NACL 50-0.9 MG/10ML-% IV SOSY
PREFILLED_SYRINGE | INTRAVENOUS | Status: DC | PRN
  Administered 2021-04-04 (×2): 5 mg via INTRAVENOUS

## 2021-04-04 MED ORDER — ROCURONIUM BROMIDE 10 MG/ML (PF) SYRINGE
PREFILLED_SYRINGE | INTRAVENOUS | Status: DC | PRN
  Administered 2021-04-04: 60 mg via INTRAVENOUS

## 2021-04-04 MED ORDER — CEFAZOLIN SODIUM-DEXTROSE 2-4 GM/100ML-% IV SOLN
2.0000 g | INTRAVENOUS | Status: AC
  Administered 2021-04-04: 2 g via INTRAVENOUS
  Filled 2021-04-04: qty 100

## 2021-04-04 MED ORDER — OXYCODONE HCL 5 MG PO TABS
5.0000 mg | ORAL_TABLET | Freq: Once | ORAL | Status: AC | PRN
  Administered 2021-04-04: 5 mg via ORAL

## 2021-04-04 MED ORDER — ONDANSETRON HCL 4 MG/2ML IJ SOLN: 4.0000 mg | Freq: Four times a day (QID) | INTRAMUSCULAR | Status: DC | PRN

## 2021-04-04 MED ORDER — ONDANSETRON HCL 4 MG/2ML IJ SOLN
INTRAMUSCULAR | Status: DC | PRN
  Administered 2021-04-04: 4 mg via INTRAVENOUS

## 2021-04-04 MED ORDER — ROPIVACAINE HCL 5 MG/ML IJ SOLN
INTRAMUSCULAR | Status: DC | PRN
  Administered 2021-04-04: 20 mL via PERINEURAL

## 2021-04-04 MED ORDER — ORAL CARE MOUTH RINSE: 15.0000 mL | Freq: Once | OROMUCOSAL | Status: AC

## 2021-04-04 MED ORDER — FENTANYL CITRATE (PF) 250 MCG/5ML IJ SOLN
INTRAMUSCULAR | Status: AC
  Filled 2021-04-04: qty 5

## 2021-04-04 MED ORDER — FENTANYL CITRATE (PF) 100 MCG/2ML IJ SOLN: 25.0000 ug | INTRAMUSCULAR | Status: DC | PRN

## 2021-04-04 MED ORDER — HEMOSTATIC AGENTS (NO CHARGE) OPTIME
TOPICAL | Status: DC | PRN
  Administered 2021-04-04: 1 via TOPICAL

## 2021-04-04 MED ORDER — EPHEDRINE 5 MG/ML INJ
INTRAVENOUS | Status: AC
  Filled 2021-04-04: qty 5

## 2021-04-04 MED ORDER — PROPOFOL 10 MG/ML IV BOLUS
INTRAVENOUS | Status: DC | PRN
  Administered 2021-04-04: 150 mg via INTRAVENOUS

## 2021-04-04 MED ORDER — ACETAMINOPHEN 500 MG PO TABS
1000.0000 mg | ORAL_TABLET | ORAL | Status: AC
  Administered 2021-04-04: 1000 mg via ORAL
  Filled 2021-04-04: qty 2

## 2021-04-04 MED ORDER — LACTATED RINGERS IV SOLN: INTRAVENOUS | Status: DC

## 2021-04-04 MED ORDER — SUGAMMADEX SODIUM 200 MG/2ML IV SOLN
INTRAVENOUS | Status: DC | PRN
  Administered 2021-04-04: 200 mg via INTRAVENOUS

## 2021-04-04 MED ORDER — MAGTRACE LYMPHATIC TRACER
INTRAMUSCULAR | Status: DC | PRN
  Administered 2021-04-04: 2 mL via INTRAMUSCULAR

## 2021-04-04 SURGICAL SUPPLY — 40 items
APPLIER CLIP 9.375 MED OPEN (MISCELLANEOUS) ×2
BAG COUNTER SPONGE SURGICOUNT (BAG) ×2 IMPLANT
CANISTER SUCT 3000ML PPV (MISCELLANEOUS) ×2 IMPLANT
CHLORAPREP W/TINT 26 (MISCELLANEOUS) ×2 IMPLANT
CLIP APPLIE 9.375 MED OPEN (MISCELLANEOUS) ×1 IMPLANT
CLIP VESOCCLUDE MED 6/CT (CLIP) ×2 IMPLANT
CNTNR URN SCR LID CUP LEK RST (MISCELLANEOUS) ×2 IMPLANT
CONT SPEC 4OZ STRL OR WHT (MISCELLANEOUS) ×2
COVER PROBE W GEL 5X96 (DRAPES) ×2 IMPLANT
COVER SURGICAL LIGHT HANDLE (MISCELLANEOUS) ×2 IMPLANT
DERMABOND ADVANCED (GAUZE/BANDAGES/DRESSINGS) ×1
DERMABOND ADVANCED .7 DNX12 (GAUZE/BANDAGES/DRESSINGS) ×1 IMPLANT
DEVICE DUBIN SPECIMEN MAMMOGRA (MISCELLANEOUS) ×2 IMPLANT
DRAPE CHEST BREAST 15X10 FENES (DRAPES) ×2 IMPLANT
ELECT COATED BLADE 2.86 ST (ELECTRODE) ×2 IMPLANT
ELECT REM PT RETURN 9FT ADLT (ELECTROSURGICAL) ×2
ELECTRODE REM PT RTRN 9FT ADLT (ELECTROSURGICAL) ×1 IMPLANT
GLOVE SURG ENC MOIS LTX SZ7 (GLOVE) ×2 IMPLANT
GLOVE SURG UNDER POLY LF SZ7.5 (GLOVE) ×2 IMPLANT
GOWN STRL REUS W/ TWL LRG LVL3 (GOWN DISPOSABLE) ×2 IMPLANT
GOWN STRL REUS W/TWL LRG LVL3 (GOWN DISPOSABLE) ×2
HEMOSTAT ARISTA ABSORB 3G PWDR (HEMOSTASIS) ×2 IMPLANT
KIT BASIN OR (CUSTOM PROCEDURE TRAY) ×2 IMPLANT
KIT MARKER MARGIN INK (KITS) ×2 IMPLANT
NEEDLE HYPO 25GX1X1/2 BEV (NEEDLE) ×2 IMPLANT
NS IRRIG 1000ML POUR BTL (IV SOLUTION) ×2 IMPLANT
PACK GENERAL/GYN (CUSTOM PROCEDURE TRAY) ×2 IMPLANT
RETRACTOR ONETRAX LX 90X20 (MISCELLANEOUS) ×2 IMPLANT
SPONGE T-LAP 18X18 ~~LOC~~+RFID (SPONGE) ×2 IMPLANT
STRIP CLOSURE SKIN 1/2X4 (GAUZE/BANDAGES/DRESSINGS) ×2 IMPLANT
SUT MNCRL AB 4-0 PS2 18 (SUTURE) ×4 IMPLANT
SUT SILK 2 0 SH (SUTURE) ×2 IMPLANT
SUT VIC AB 2-0 SH 27 (SUTURE) ×2
SUT VIC AB 2-0 SH 27XBRD (SUTURE) ×2 IMPLANT
SUT VIC AB 3-0 SH 27 (SUTURE) ×3
SUT VIC AB 3-0 SH 27X BRD (SUTURE) ×3 IMPLANT
SYR CONTROL 10ML LL (SYRINGE) ×2 IMPLANT
TOWEL GREEN STERILE (TOWEL DISPOSABLE) ×2 IMPLANT
TOWEL GREEN STERILE FF (TOWEL DISPOSABLE) ×2 IMPLANT
TRACER MAGTRACE VIAL (MISCELLANEOUS) ×2 IMPLANT

## 2021-04-04 NOTE — Transfer of Care (Signed)
Immediate Anesthesia Transfer of Care Note  Patient: Donna Mcconnell  Procedure(s) Performed: RIGHT BREAST LUMPECTOMY WITH RADIOACTIVE SEED AND AXILLARY SENTINEL LYMPH NODE BIOPSY (Right: Breast)  Patient Location: PACU  Anesthesia Type:GA combined with regional for post-op pain  Level of Consciousness: awake, alert , oriented and patient cooperative  Airway & Oxygen Therapy: Patient Spontanous Breathing and Patient connected to nasal cannula oxygen  Post-op Assessment: Report given to RN and Post -op Vital signs reviewed and stable  Post vital signs: Reviewed and stable  Last Vitals:  Vitals Value Taken Time  BP 142/78 04/04/21 0902  Temp    Pulse 78 04/04/21 0903  Resp 14 04/04/21 0903  SpO2 100 % 04/04/21 0903  Vitals shown include unvalidated device data.  Last Pain:  Vitals:   04/04/21 0621  TempSrc:   PainSc: 0-No pain         Complications: No notable events documented.

## 2021-04-04 NOTE — Discharge Instructions (Signed)
[place] Office Phone Number [phone]  BREAST BIOPSY/ PARTIAL MASTECTOMY: POST OP INSTRUCTIONS Take 400 mg of ibuprofen every 8 hours or 650 mg tylenol every 6 hours for next 72 hours then as needed. Use ice several times daily also. Always review your discharge instruction sheet given to you by the facility where your surgery was performed.  IF YOU HAVE DISABILITY OR FAMILY LEAVE FORMS, YOU MUST BRING THEM TO THE OFFICE FOR PROCESSING.  DO NOT GIVE THEM TO YOUR DOCTOR.  A prescription for pain medication may be given to you upon discharge.  Take your pain medication as prescribed, if needed.  If narcotic pain medicine is not needed, then you may take acetaminophen (Tylenol), naprosyn (Alleve) or ibuprofen (Advil) as needed. Take your usually prescribed medications unless otherwise directed If you need a refill on your pain medication, please contact your pharmacy.  They will contact our office to request authorization.  Prescriptions will not be filled after 5pm or on week-ends. You should eat very light the first 24 hours after surgery, such as soup, crackers, pudding, etc.  Resume your normal diet the day after surgery. Most patients will experience some swelling and bruising in the breast.  Ice packs and a good support bra will help.  Wear the breast binder provided or a sports bra for 72 hours day and night.  After that wear a sports bra during the day until you return to the office. Swelling and bruising can take several days to resolve.  It is common to experience some constipation if taking pain medication after surgery.  Increasing fluid intake and taking a stool softener will usually help or prevent this problem from occurring.  A mild laxative (Milk of Magnesia or Miralax) should be taken according to package directions if there are no bowel movements after 48 hours. Unless discharge instructions indicate otherwise, you may remove your bandages 48 hours after surgery  and you may shower at that time.  You may have steri-strips (small skin tapes) in place directly over the incision.  These strips should be left on the skin for 7-10 days and will come off on their own.  If your surgeon used skin glue on the incision, you may shower in 24 hours.  The glue will flake off over the next 2-3 weeks.  Any sutures or staples will be removed at the office during your follow-up visit. ACTIVITIES:  You may resume regular daily activities (gradually increasing) beginning the next day.  Wearing a good support bra or sports bra minimizes pain and swelling.  You may have sexual intercourse when it is comfortable. You may drive when you no longer are taking prescription pain medication, you can comfortably wear a seatbelt, and you can safely maneuver your car and apply brakes. RETURN TO WORK:  ______________________________________________________________________________________ Dennis Bast should see your doctor in the office for a follow-up appointment approximately two weeks after your surgery.  Your doctor's nurse will typically make your follow-up appointment when she calls you with your pathology report.  Expect your pathology report 3-4 business days after your surgery.  You may call to check if you do not hear from [place] after three days. OTHER INSTRUCTIONS: _______________________________________________________________________________________________ _____________________________________________________________________________________________________________________________________ _____________________________________________________________________________________________________________________________________ _____________________________________________________________________________________________________________________________________  WHEN TO CALL DR Jeramey Lanuza: Fever over 101.0 Nausea and/or vomiting. Extreme swelling or bruising. Continued bleeding from incision. Increased  pain, redness, or drainage from the incision.  The clinic staff is available to answer your questions during regular business hours.  Please don't hesitate to call  and ask to speak to one of the nurses for clinical concerns.  If you have a medical emergency, go to the nearest emergency room or call 911.  A surgeon from [facility] is always on call at the hospital.  For further questions, please visit [email or web address]

## 2021-04-04 NOTE — Op Note (Signed)
Preoperative diagnosis: Clinical stage I right breast cancer Postoperative diagnosis: Same as above Procedure: 1.  Right breast radioactive seed guided lumpectomy 2.  Right deep axillary sentinel lymph node biopsy 3.  Injection of mag trace for sentinel lymph node identification Surgeon: Dr. Serita Grammes Anesthesia: General with a pectoral block Estimated blood loss: Minimal Complications: None Drains: None Specimens: 1.  Right breast lumpectomy containing 1 radioactive seed and 2 clips confirmed by mammography this was marked with paint 2.  Additional posterior margin marked with sutures short superior, long lateral, double deep 3.  Right deep axillary sentinel lymph nodes with highest count of 4723 Sponge and count was correct at completion Disposition to recovery in stable condition  Indications: This is 60 year old female who presents after an abnormal mammogram showing a mass and calcifications measuring 1.1 cm.  Ultrasound showed an 8 mm mass.  Axillary ultrasound was negative.  She had a biopsy of the calcifications which are flat epithelial atypia and atypical lobular hyperplasia.  The mass is a grade 1-2 invasive ductal carcinoma that is ER/PR positive.  We discussed all of her options and she elected for breast conservation therapy followed by oncoplastic reduction and lift a week later if her margins are negative.  Procedure: After informed consent was obtained the patient was given antibiotics.  SCDs were in place.  She underwent a pectoral block.  Timeout was performed.  I then prepped the area around the areola on the right side.  I then injected 2 cc of mag trace in the subareolar position.  She was then later taken to the operating room.  She was placed under general anesthesia without complication.  She was prepped and draped in the standard sterile surgical fashion.  Surgical timeout was then performed.  I first did the lumpectomy.  Plastic surgery had marked her for the  reduction pattern.  I identified the radioactive seed in the superior breast.  I then made a curvilinear incision after infiltrating Marcaine in the pattern that she will have removed with the reduction next week hopefully.  I then used cautery to develop planes.  I identified the radioactive seed.  I removed the seed and the surrounding tissue with an attempt to get a clear margin.  Mammogram confirmed removal of 2 clips and the seed.  I thought I might be close to the posterior margin so I removed additional margin and marked it as above.  This was then passed off the table and sent to pathology.  This was hemostatic.  I closed the cavity down with 2-0 Vicryl.  The skin was closed with 3-0 Vicryl for Monocryl.  Glue and Steri-Strips were eventually applied.  I then was able to identify the mag trace in the axilla.  I made an incision below the axillary hairline.  I carried this to the axillary fascia.  I was able to identify what appeared to be 2 or 3 brown stained lymph nodes that also had activity on the probe.  I remove these.  There was no additional palpable adenopathy or any activity in the axilla.  I then obtained hemostasis.  I closed the axillary fascia with 2-0 Vicryl.  The skin was closed with 3-0 Vicryl and 4-0 Monocryl.  Glue and Steri-Strips were applied.  She tolerated this well.  She was extubated and transferred to recovery in stable condition.

## 2021-04-04 NOTE — Anesthesia Postprocedure Evaluation (Signed)
Anesthesia Post Note  Patient: IVER FEHRENBACH  Procedure(s) Performed: RIGHT BREAST LUMPECTOMY WITH RADIOACTIVE SEED AND AXILLARY SENTINEL LYMPH NODE BIOPSY (Right: Breast)     Patient location during evaluation: PACU Anesthesia Type: General Level of consciousness: awake and alert Pain management: pain level controlled Vital Signs Assessment: post-procedure vital signs reviewed and stable Respiratory status: spontaneous breathing, nonlabored ventilation, respiratory function stable and patient connected to nasal cannula oxygen Cardiovascular status: blood pressure returned to baseline and stable Postop Assessment: no apparent nausea or vomiting Anesthetic complications: no   No notable events documented.  Last Vitals:  Vitals:   04/04/21 0917 04/04/21 0932  BP: (!) 152/73 (!) 156/73  Pulse: 63 66  Resp: 12 16  Temp:  36.5 C  SpO2: 100% 100%    Last Pain:  Vitals:   04/04/21 0932  TempSrc:   PainSc: [place]

## 2021-04-04 NOTE — Interval H&P Note (Signed)
History and Physical Interval Note:  04/04/2021 6:57 AM  Donna Mcconnell  has presented today for surgery, with the diagnosis of RIGHT BREAST CANCER.  The various methods of treatment have been discussed with the patient and family. After consideration of risks, benefits and other options for treatment, the patient has consented to  Procedure(s): RIGHT BREAST LUMPECTOMY WITH RADIOACTIVE SEED AND AXILLARY SENTINEL LYMPH NODE BIOPSY (Right) as a surgical intervention.  The patient's history has been reviewed, patient examined, no change in status, stable for surgery.  I have reviewed the patient's chart and labs.  Questions were answered to the patient's satisfaction.     Rolm Bookbinder

## 2021-04-04 NOTE — H&P (Signed)
17 yof with no prior breast history and no mass or discharge presents after a screening mammogram that showed the density breast. She has a mass and calcifications measuring 1.1 cm on her mammogram. Ultrasound shows an 8 mm mass. Her axillary ultrasound is negative. Biopsy of the calcifications are flat epithelial atypia and atypical lobular hyperplasia. Biopsy of the mass is a grade 1-2 invasive ductal carcinoma with DCIS that is greater than 95% ER/PR positive, HER2 negative, and Ki 67 is 1%. She is here with her husband to discuss her options. She is retired and lives in [place].   Review of Systems: A complete review of systems was obtained from the patient. I have reviewed this information and discussed as appropriate with the patient. See HPI as well for other ROS.  Review of Systems  Musculoskeletal: Positive for joint pain.  Psychiatric/Behavioral: The patient is nervous/anxious.  All other systems reviewed and are negative.   Medical History: Past Medical History:  Diagnosis Date   Anxiety   GERD (gastroesophageal reflux disease)   History of cancer   Hypertension   Patient Active Problem List  Diagnosis   Cancer of right breast (CMS-HCC)   Essential hypertension   Gastro-esophageal reflux disease without esophagitis   Malignant neoplasm of upper-outer quadrant of right breast in female, estrogen receptor positive (CMS-HCC)   Past Surgical History:  Procedure Laterality Date   ADENOIDECTOMY N/A  Date Unknown   cercalage N/A  Date Unknown   COLONOSCOPY N/A  Date Unknown   TONSILLECTOMY N/A  Date Unknown   tympanostomy tube placement N/A  Date Unknown    Allergies  Allergen Reactions   Shellfish Containing Products Itching  Mussels   Current Outpatient Medications on File Prior to Visit  Medication Sig Dispense Refill   clindamycin (CLEOCIN) 150 MG capsule 1 capsule by mouth 4 times a day until complete   doxycycline (VIBRA-TABS) 100 MG tablet Take 100 mg  by mouth 2 (two) times daily   pantoprazole (PROTONIX) 40 MG DR tablet Take 1 tablet by mouth once daily   valsartan (DIOVAN) 80 MG tablet Take 80 mg by mouth once daily   No current facility-administered medications on file prior to visit.   Family History  Problem Relation Age of Onset   Obesity Mother   High blood pressure (Hypertension) Mother   Hyperlipidemia (Elevated cholesterol) Mother   Coronary Artery Disease (Blocked arteries around heart) Mother   Obesity Father   High blood pressure (Hypertension) Father   Hyperlipidemia (Elevated cholesterol) Father   Diabetes Father   Deep vein thrombosis (DVT or abnormal blood clot formation) Father    Social History   Tobacco Use  Smoking Status Never Smoker  Smokeless Tobacco Never Used    Social History   Socioeconomic History   Marital status: Married  Tobacco Use   Smoking status: Never Smoker   Smokeless tobacco: Never Used  Scientific laboratory technician Use: Never used  Substance and Sexual Activity   Alcohol use: Yes  Comment: 3 glasses a week   Drug use: Never   Objective:   Physical Exam Constitutional:  Appearance: Normal appearance.  Cardiovascular:  Rate and Rhythm: Normal rate.  Pulmonary:  Effort: Pulmonary effort is normal.  Chest:  Breasts:  Right: No inverted nipple, mass or nipple discharge.  Left: No inverted nipple, mass or nipple discharge.  Lymphadenopathy:  Upper Body:  Right upper body: No supraclavicular or axillary adenopathy.  Left upper body: No supraclavicular or axillary adenopathy.  Neurological:  Mental Status: She is alert.   Assessment and Plan:   Malignant neoplasm of upper-outer quadrant of right breast in female, estrogen receptor positive (CMS-HCC)  Right breast seed guided lumpectomy, right ax sn biopsy  We discussed the staging and pathophysiology of breast cancer. We discussed all of the different options for treatment for breast cancer including surgery, chemotherapy,  radiation therapy, Herceptin, and antiestrogen therapy. We discussed a sentinel lymph node biopsy as she does not appear to having lymph node involvement right now. We discussed the performance of that with injection of radioactive tracer. We discussed that there is a chance of having a positive node with a sentinel lymph node biopsy and we will await the permanent pathology to make any other first further decisions in terms of her treatment. We discussed up to a 5% risk lifetime of chronic shoulder pain as well as lymphedema associated with a sentinel lymph node biopsy. We discussed the options for treatment of the breast cancer which included lumpectomy versus a mastectomy. We discussed the performance of the lumpectomy with radioactive seed placement. We discussed a 5-10% chance of a positive margin requiring reexcision in the operating room. We also discussed that she will need radiation therapy if she undergoes lumpectomy. We discussed mastectomy and the postoperative care for that as well. Mastectomy can be followed by reconstruction. The decision for lumpectomy vs mastectomy has no impact on decision for chemotherapy. Most mastectomy patients will not need radiation therapy. We discussed that there is no difference in her survival whether she undergoes lumpectomy with radiation therapy or antiestrogen therapy versus a mastectomy. There is also no real difference between her recurrence in the breast. She would also be candidate for reduction lumpectomy. I am going to have her see plastic surgery to discuss this. She is interested in bilateral nsm with reconstruction. I spent some time explaining this has no medical advantage to her in terms of recurrence or survival.  We discussed the risks of operation including bleeding, infection, possible reoperation. She understands her further therapy will be based on what her stages at the time of her operation.

## 2021-04-04 NOTE — Anesthesia Procedure Notes (Signed)
Procedure Name: Intubation Date/Time: 04/04/2021 7:39 AM Performed by: Colin Benton, CRNA Pre-anesthesia Checklist: Patient identified, Emergency Drugs available, Suction available and Patient being monitored Patient Re-evaluated:Patient Re-evaluated prior to induction Oxygen Delivery Method: Circle system utilized Preoxygenation: Pre-oxygenation with 100% oxygen Induction Type: IV induction Ventilation: Mask ventilation without difficulty Laryngoscope Size: Glidescope and 3 Grade View: Grade I Tube type: Oral Tube size: 7.0 mm Number of attempts: 1 Airway Equipment and Method: Oral airway, Rigid stylet and Video-laryngoscopy Placement Confirmation: ETT inserted through vocal cords under direct vision, positive ETCO2 and breath sounds checked- equal and bilateral Secured at: 22 cm Tube secured with: Tape Dental Injury: Teeth and Oropharynx as per pre-operative assessment  Comments: Patient with history of bone graft and tumor resection to left lower  jaw. Easy BMV while avoiding resected area for mask seal. Elective glidescope intubation.

## 2021-04-04 NOTE — Anesthesia Procedure Notes (Signed)
Anesthesia Regional Block: Pectoralis block   Pre-Anesthetic Checklist: , timeout performed,  Correct Patient, Correct Site, Correct Laterality,  Correct Procedure, Correct Position, site marked,  Risks and benefits discussed,  Surgical consent,  Pre-op evaluation,  At surgeon's request and post-op pain management  Laterality: Right  Prep: chloraprep       Needles:  Injection technique: Single-shot  Needle Type: Echogenic Needle     Needle Length: 9cm  Needle Gauge: 21     Additional Needles:   Narrative:  Start time: 04/04/2021 7:05 AM End time: 04/04/2021 7:15 AM Injection made incrementally with aspirations every 5 mL.  Performed by: Personally  Anesthesiologist: Albertha Ghee, MD  Additional Notes: Pt tolerated the procedure well.

## 2021-04-05 ENCOUNTER — Encounter (HOSPITAL_COMMUNITY): Payer: Self-pay | Admitting: General Surgery

## 2021-04-08 LAB — SURGICAL PATHOLOGY

## 2021-04-10 ENCOUNTER — Telehealth: Payer: Self-pay | Admitting: *Deleted

## 2021-04-10 ENCOUNTER — Encounter: Payer: Self-pay | Admitting: *Deleted

## 2021-04-10 NOTE — Telephone Encounter (Signed)
Ordered oncotype per Dr. Burr Medico. Faxed requisition to pathology and exact sciences.

## 2021-04-12 ENCOUNTER — Encounter (HOSPITAL_COMMUNITY): Payer: Self-pay | Admitting: Plastic Surgery

## 2021-04-12 ENCOUNTER — Other Ambulatory Visit: Payer: Self-pay

## 2021-04-12 NOTE — Progress Notes (Signed)
Spoke with pt for pre-op call. Pt just had surgery here a week ago. She states nothing has changed with her medical history.   Pt's surgery is scheduled as ambulatory so no Covid test is required prior to surgery.  I left a Pre-surgery Ensure at the front desk at the [facility] entrance for pt to pick up. She stated that she felt that had a lot to do with how good she felt after surgery.

## 2021-04-14 NOTE — Anesthesia Preprocedure Evaluation (Addendum)
Anesthesia Evaluation  Patient identified by MRN, date of birth, ID band Patient awake    Reviewed: Allergy & Precautions, NPO status , Patient's Chart, lab work & pertinent test results  History of Anesthesia Complications (+) PONV  Airway Mallampati: I  TM Distance: >3 FB Neck ROM: Full    Dental  (+) Dental Advisory Given, Missing   Pulmonary neg pulmonary ROS,    breath sounds clear to auscultation       Cardiovascular hypertension, Pt. on medications (-) angina Rhythm:Regular Rate:Normal     Neuro/Psych negative neurological ROS     GI/Hepatic Neg liver ROS, GERD  Medicated and Controlled,  Endo/Other  negative endocrine ROS  Renal/GU negative Renal ROS     Musculoskeletal  (+) Arthritis ,   Abdominal   Peds  Hematology negative hematology ROS (+)   Anesthesia Other Findings Breast cancer Jaw tumor: s/p resection and reconstruction  Reproductive/Obstetrics                            Anesthesia Physical Anesthesia Plan  ASA: 3  Anesthesia Plan: General   Post-op Pain Management: Tylenol PO (pre-op)   Induction: Intravenous  PONV Risk Score and Plan: 3 and Ondansetron, Dexamethasone and Scopolamine patch - Pre-op  Airway Management Planned: Oral ETT  Additional Equipment: None  Intra-op Plan:   Post-operative Plan: Extubation in OR  Informed Consent: I have reviewed the patients History and Physical, chart, labs and discussed the procedure including the risks, benefits and alternatives for the proposed anesthesia with the patient or authorized representative who has indicated his/her understanding and acceptance.     Dental advisory given  Plan Discussed with: CRNA and Surgeon  Anesthesia Plan Comments:        Anesthesia Quick Evaluation

## 2021-04-15 ENCOUNTER — Other Ambulatory Visit: Payer: Self-pay

## 2021-04-15 ENCOUNTER — Ambulatory Visit (HOSPITAL_COMMUNITY): Payer: BC Managed Care – PPO | Admitting: Certified Registered Nurse Anesthetist

## 2021-04-15 ENCOUNTER — Encounter (HOSPITAL_COMMUNITY): Payer: Self-pay | Admitting: Plastic Surgery

## 2021-04-15 ENCOUNTER — Encounter (HOSPITAL_COMMUNITY)
Admission: RE | Disposition: A | Discharge status: Home / Self Care | Payer: Self-pay | Source: Ambulatory Visit | Attending: Plastic Surgery

## 2021-04-15 ENCOUNTER — Ambulatory Visit (HOSPITAL_COMMUNITY)
Admission: RE | Admit: 2021-04-15 | Discharge: 2021-04-15 | Disposition: A | Discharge status: Home / Self Care | Payer: BC Managed Care – PPO | Source: Ambulatory Visit | Attending: Plastic Surgery | Admitting: Plastic Surgery

## 2021-04-15 DIAGNOSIS — M549 Dorsalgia, unspecified: Secondary | ICD-10-CM | POA: Insufficient documentation

## 2021-04-15 DIAGNOSIS — C50411 Malignant neoplasm of upper-outer quadrant of right female breast: Secondary | ICD-10-CM | POA: Diagnosis not present

## 2021-04-15 DIAGNOSIS — I10 Essential (primary) hypertension: Secondary | ICD-10-CM | POA: Insufficient documentation

## 2021-04-15 DIAGNOSIS — C50911 Malignant neoplasm of unspecified site of right female breast: Secondary | ICD-10-CM | POA: Diagnosis not present

## 2021-04-15 DIAGNOSIS — Z9889 Other specified postprocedural states: Secondary | ICD-10-CM | POA: Diagnosis not present

## 2021-04-15 DIAGNOSIS — Z853 Personal history of malignant neoplasm of breast: Secondary | ICD-10-CM | POA: Insufficient documentation

## 2021-04-15 DIAGNOSIS — N62 Hypertrophy of breast: Secondary | ICD-10-CM | POA: Insufficient documentation

## 2021-04-15 DIAGNOSIS — G8929 Other chronic pain: Secondary | ICD-10-CM | POA: Diagnosis not present

## 2021-04-15 DIAGNOSIS — K219 Gastro-esophageal reflux disease without esophagitis: Secondary | ICD-10-CM | POA: Diagnosis not present

## 2021-04-15 DIAGNOSIS — M542 Cervicalgia: Secondary | ICD-10-CM | POA: Diagnosis not present

## 2021-04-15 DIAGNOSIS — Z170 Estrogen receptor positive status [ER+]: Secondary | ICD-10-CM | POA: Diagnosis not present

## 2021-04-15 DIAGNOSIS — Z79899 Other long term (current) drug therapy: Secondary | ICD-10-CM | POA: Insufficient documentation

## 2021-04-15 DIAGNOSIS — N641 Fat necrosis of breast: Secondary | ICD-10-CM | POA: Insufficient documentation

## 2021-04-15 DIAGNOSIS — E785 Hyperlipidemia, unspecified: Secondary | ICD-10-CM | POA: Diagnosis not present

## 2021-04-15 HISTORY — PX: BREAST RECONSTRUCTION: SHX9

## 2021-04-15 HISTORY — PX: BREAST REDUCTION SURGERY: SHX8

## 2021-04-15 SURGERY — RECONSTRUCTION, BREAST
Anesthesia: General | Site: Breast | Laterality: Right

## 2021-04-15 MED ORDER — ACETAMINOPHEN 500 MG PO TABS
1000.0000 mg | ORAL_TABLET | Freq: Once | ORAL | Status: AC
  Administered 2021-04-15: 06:00:00 1000 mg via ORAL

## 2021-04-15 MED ORDER — ONDANSETRON HCL 4 MG/2ML IJ SOLN
INTRAMUSCULAR | Status: DC | PRN
  Administered 2021-04-15: 4 mg via INTRAVENOUS

## 2021-04-15 MED ORDER — MEPERIDINE HCL 25 MG/ML IJ SOLN: 6.2500 mg | INTRAMUSCULAR | Status: DC | PRN

## 2021-04-15 MED ORDER — LACTATED RINGERS IV SOLN: INTRAVENOUS | Status: DC | PRN

## 2021-04-15 MED ORDER — PHENYLEPHRINE HCL-NACL 20-0.9 MG/250ML-% IV SOLN
INTRAVENOUS | Status: DC | PRN
  Administered 2021-04-15: 15 ug/min via INTRAVENOUS

## 2021-04-15 MED ORDER — PROPOFOL 10 MG/ML IV BOLUS
INTRAVENOUS | Status: AC
  Filled 2021-04-15: qty 20

## 2021-04-15 MED ORDER — BUPIVACAINE HCL (PF) 0.5 % IJ SOLN
INTRAMUSCULAR | Status: DC | PRN
  Administered 2021-04-15: 30 mL

## 2021-04-15 MED ORDER — FENTANYL CITRATE (PF) 250 MCG/5ML IJ SOLN
INTRAMUSCULAR | Status: DC | PRN
  Administered 2021-04-15: 150 ug via INTRAVENOUS
  Administered 2021-04-15: 50 ug via INTRAVENOUS

## 2021-04-15 MED ORDER — MIDAZOLAM HCL 2 MG/2ML IJ SOLN: 0.5000 mg | Freq: Once | INTRAMUSCULAR | Status: DC | PRN

## 2021-04-15 MED ORDER — OXYCODONE HCL 5 MG PO TABS
5.0000 mg | ORAL_TABLET | Freq: Once | ORAL | Status: AC | PRN
  Administered 2021-04-15: 11:00:00 5 mg via ORAL

## 2021-04-15 MED ORDER — SUGAMMADEX SODIUM 200 MG/2ML IV SOLN
INTRAVENOUS | Status: DC | PRN
  Administered 2021-04-15: 200 mg via INTRAVENOUS

## 2021-04-15 MED ORDER — OXYCODONE HCL 5 MG PO TABS: 5.0000 mg | ORAL_TABLET | Freq: Four times a day (QID) | ORAL | 0 refills | Status: DC | PRN

## 2021-04-15 MED ORDER — GABAPENTIN 300 MG PO CAPS
300.0000 mg | ORAL_CAPSULE | ORAL | Status: AC
  Administered 2021-04-15: 06:00:00 300 mg via ORAL
  Filled 2021-04-15: qty 1

## 2021-04-15 MED ORDER — OXYCODONE HCL 5 MG PO TABS
ORAL_TABLET | ORAL | Status: AC
  Filled 2021-04-15: qty 1

## 2021-04-15 MED ORDER — MIDAZOLAM HCL 2 MG/2ML IJ SOLN
INTRAMUSCULAR | Status: AC
  Filled 2021-04-15: qty 2

## 2021-04-15 MED ORDER — ROCURONIUM BROMIDE 10 MG/ML (PF) SYRINGE
PREFILLED_SYRINGE | INTRAVENOUS | Status: AC
  Filled 2021-04-15: qty 10

## 2021-04-15 MED ORDER — BUPIVACAINE HCL (PF) 0.5 % IJ SOLN
INTRAMUSCULAR | Status: AC
  Filled 2021-04-15: qty 30

## 2021-04-15 MED ORDER — CEFAZOLIN SODIUM-DEXTROSE 2-4 GM/100ML-% IV SOLN
2.0000 g | INTRAVENOUS | Status: AC
  Administered 2021-04-15: 08:00:00 2 g via INTRAVENOUS
  Filled 2021-04-15: qty 100

## 2021-04-15 MED ORDER — PROMETHAZINE HCL 25 MG/ML IJ SOLN: 6.2500 mg | INTRAMUSCULAR | Status: DC | PRN

## 2021-04-15 MED ORDER — CHLORHEXIDINE GLUCONATE CLOTH 2 % EX PADS: 6.0000 | MEDICATED_PAD | Freq: Once | CUTANEOUS | Status: DC

## 2021-04-15 MED ORDER — MIDAZOLAM HCL 5 MG/5ML IJ SOLN
INTRAMUSCULAR | Status: DC | PRN
  Administered 2021-04-15: 2 mg via INTRAVENOUS

## 2021-04-15 MED ORDER — DEXAMETHASONE SODIUM PHOSPHATE 10 MG/ML IJ SOLN
INTRAMUSCULAR | Status: DC | PRN
  Administered 2021-04-15: 5 mg via INTRAVENOUS

## 2021-04-15 MED ORDER — FENTANYL CITRATE (PF) 250 MCG/5ML IJ SOLN
INTRAMUSCULAR | Status: AC
  Filled 2021-04-15: qty 5

## 2021-04-15 MED ORDER — HYDROMORPHONE HCL 1 MG/ML IJ SOLN
INTRAMUSCULAR | Status: AC
  Filled 2021-04-15: qty 1

## 2021-04-15 MED ORDER — OXYCODONE HCL 5 MG/5ML PO SOLN: 5.0000 mg | Freq: Once | ORAL | Status: AC | PRN

## 2021-04-15 MED ORDER — ROCURONIUM BROMIDE 10 MG/ML (PF) SYRINGE
PREFILLED_SYRINGE | INTRAVENOUS | Status: DC | PRN
  Administered 2021-04-15: 20 mg via INTRAVENOUS
  Administered 2021-04-15: 60 mg via INTRAVENOUS
  Administered 2021-04-15: 20 mg via INTRAVENOUS

## 2021-04-15 MED ORDER — ONDANSETRON HCL 4 MG/2ML IJ SOLN
INTRAMUSCULAR | Status: AC
  Filled 2021-04-15: qty 2

## 2021-04-15 MED ORDER — HYDROMORPHONE HCL 1 MG/ML IJ SOLN
0.2500 mg | INTRAMUSCULAR | Status: DC | PRN
  Administered 2021-04-15: 11:00:00 0.25 mg via INTRAVENOUS
  Administered 2021-04-15: 11:00:00 0.5 mg via INTRAVENOUS

## 2021-04-15 MED ORDER — 0.9 % SODIUM CHLORIDE (POUR BTL) OPTIME
TOPICAL | Status: DC | PRN
  Administered 2021-04-15: 08:00:00 500 mL
  Administered 2021-04-15: 09:00:00 1000 mL

## 2021-04-15 MED ORDER — LIDOCAINE 2% (20 MG/ML) 5 ML SYRINGE
INTRAMUSCULAR | Status: AC
  Filled 2021-04-15: qty 5

## 2021-04-15 MED ORDER — PHENYLEPHRINE 40 MCG/ML (10ML) SYRINGE FOR IV PUSH (FOR BLOOD PRESSURE SUPPORT)
PREFILLED_SYRINGE | INTRAVENOUS | Status: DC | PRN
Start: 2021-04-15 — End: 2021-04-15
  Administered 2021-04-15 (×5): 80 ug via INTRAVENOUS

## 2021-04-15 MED ORDER — PHENYLEPHRINE 40 MCG/ML (10ML) SYRINGE FOR IV PUSH (FOR BLOOD PRESSURE SUPPORT)
PREFILLED_SYRINGE | INTRAVENOUS | Status: AC
  Filled 2021-04-15: qty 10

## 2021-04-15 MED ORDER — PROPOFOL 10 MG/ML IV BOLUS
INTRAVENOUS | Status: DC | PRN
  Administered 2021-04-15: 150 mg via INTRAVENOUS

## 2021-04-15 MED ORDER — DEXAMETHASONE SODIUM PHOSPHATE 10 MG/ML IJ SOLN
INTRAMUSCULAR | Status: AC
  Filled 2021-04-15: qty 1

## 2021-04-15 MED ORDER — ACETAMINOPHEN 500 MG PO TABS
1000.0000 mg | ORAL_TABLET | ORAL | Status: DC
  Filled 2021-04-15: qty 2

## 2021-04-15 MED ORDER — SCOPOLAMINE 1 MG/3DAYS TD PT72
1.0000 | MEDICATED_PATCH | TRANSDERMAL | Status: DC
  Administered 2021-04-15: 06:00:00 1.5 mg via TRANSDERMAL
  Filled 2021-04-15: qty 1

## 2021-04-15 MED ORDER — LIDOCAINE 2% (20 MG/ML) 5 ML SYRINGE
INTRAMUSCULAR | Status: DC | PRN
  Administered 2021-04-15: 40 mg via INTRAVENOUS

## 2021-04-15 MED ORDER — CELECOXIB 200 MG PO CAPS
200.0000 mg | ORAL_CAPSULE | ORAL | Status: AC
  Administered 2021-04-15: 06:00:00 200 mg via ORAL
  Filled 2021-04-15: qty 1

## 2021-04-15 SURGICAL SUPPLY — 48 items
BAG COUNTER SPONGE SURGICOUNT (BAG) ×3 IMPLANT
BAG SURGICOUNT SPONGE COUNTING (BAG) ×1
BINDER BREAST LRG (GAUZE/BANDAGES/DRESSINGS) IMPLANT
BINDER BREAST MEDIUM (GAUZE/BANDAGES/DRESSINGS) IMPLANT
BINDER BREAST XLRG (GAUZE/BANDAGES/DRESSINGS) IMPLANT
BINDER BREAST XXLRG (GAUZE/BANDAGES/DRESSINGS) ×2 IMPLANT
BLADE SURG 10 STRL SS (BLADE) ×10 IMPLANT
BNDG GAUZE ELAST 4 BULKY (GAUZE/BANDAGES/DRESSINGS) ×8 IMPLANT
CANISTER SUCT 3000ML PPV (MISCELLANEOUS) ×4 IMPLANT
CHLORAPREP W/TINT 26 (MISCELLANEOUS) ×8 IMPLANT
COVER SURGICAL LIGHT HANDLE (MISCELLANEOUS) ×4 IMPLANT
DECANTER SPIKE VIAL GLASS SM (MISCELLANEOUS) ×2 IMPLANT
DERMABOND ADVANCED (GAUZE/BANDAGES/DRESSINGS) ×4
DERMABOND ADVANCED .7 DNX12 (GAUZE/BANDAGES/DRESSINGS) ×4 IMPLANT
DRAIN CHANNEL 15F RND FF W/TCR (WOUND CARE) ×4 IMPLANT
DRAPE HALF SHEET 40X57 (DRAPES) ×8 IMPLANT
DRAPE ORTHO SPLIT 77X108 STRL (DRAPES) ×2
DRAPE SURG ORHT 6 SPLT 77X108 (DRAPES) IMPLANT
DRAPE TOP ARMCOVERS (MISCELLANEOUS) ×4 IMPLANT
DRAPE U-SHAPE 76X120 STRL (DRAPES) ×2 IMPLANT
DRSG PAD ABDOMINAL 8X10 ST (GAUZE/BANDAGES/DRESSINGS) ×8 IMPLANT
ELECT BLADE 4.0 EZ CLEAN MEGAD (MISCELLANEOUS) ×4
ELECT COATED BLADE 2.86 ST (ELECTRODE) ×4 IMPLANT
ELECT REM PT RETURN 9FT ADLT (ELECTROSURGICAL) ×4
ELECTRODE BLDE 4.0 EZ CLN MEGD (MISCELLANEOUS) IMPLANT
ELECTRODE REM PT RTRN 9FT ADLT (ELECTROSURGICAL) ×2 IMPLANT
EVACUATOR SILICONE 100CC (DRAIN) ×4 IMPLANT
GLOVE SURG ENC MOIS LTX SZ6 (GLOVE) ×4 IMPLANT
GOWN STRL REUS W/ TWL LRG LVL3 (GOWN DISPOSABLE) ×4 IMPLANT
GOWN STRL REUS W/TWL LRG LVL3 (GOWN DISPOSABLE) ×4
MARKER SKIN DUAL TIP RULER LAB (MISCELLANEOUS) ×2 IMPLANT
NDL HYPO 25GX1X1/2 BEV (NEEDLE) ×2 IMPLANT
NEEDLE HYPO 25GX1X1/2 BEV (NEEDLE) ×4 IMPLANT
NS IRRIG 1000ML POUR BTL (IV SOLUTION) ×4 IMPLANT
PACK GENERAL/GYN (CUSTOM PROCEDURE TRAY) ×4 IMPLANT
PIN SAFETY STERILE (MISCELLANEOUS) ×4 IMPLANT
SPONGE T-LAP 18X18 ~~LOC~~+RFID (SPONGE) ×10 IMPLANT
STAPLER VISISTAT 35W (STAPLE) ×4 IMPLANT
SUT ETHILON 2 0 FS 18 (SUTURE) ×4 IMPLANT
SUT MNCRL AB 4-0 PS2 18 (SUTURE) ×16 IMPLANT
SUT PDS AB 2-0 CT2 27 (SUTURE) ×2 IMPLANT
SUT VIC AB 3-0 PS1 18 (SUTURE) ×12
SUT VIC AB 3-0 PS1 18X BRD (SUTURE) IMPLANT
SUT VIC AB 3-0 PS1 18XBRD (SUTURE) ×8 IMPLANT
SUT VICRYL 4-0 PS2 18IN ABS (SUTURE) ×8 IMPLANT
SYR CONTROL 10ML LL (SYRINGE) ×4 IMPLANT
TOWEL GREEN STERILE (TOWEL DISPOSABLE) ×4 IMPLANT
WATER STERILE IRR 1000ML POUR (IV SOLUTION) ×2 IMPLANT

## 2021-04-15 NOTE — Anesthesia Procedure Notes (Signed)
Procedure Name: Intubation Date/Time: 04/15/2021 7:26 AM Performed by: Colin Benton, CRNA Pre-anesthesia Checklist: Patient identified, Emergency Drugs available, Suction available and Patient being monitored Patient Re-evaluated:Patient Re-evaluated prior to induction Oxygen Delivery Method: Circle system utilized Preoxygenation: Pre-oxygenation with 100% oxygen Induction Type: IV induction Ventilation: Mask ventilation without difficulty Laryngoscope Size: Glidescope and 3 Grade View: Grade I Tube type: Oral Tube size: 7.0 mm Number of attempts: 1 Airway Equipment and Method: Rigid stylet and Video-laryngoscopy Placement Confirmation: ETT inserted through vocal cords under direct vision, positive ETCO2 and breath sounds checked- equal and bilateral Secured at: 22 cm Tube secured with: Tape Dental Injury: Teeth and Oropharynx as per pre-operative assessment  Comments: Elective glidescope intubation due to previous jaw surgery and tumor removal with current stitches in left lower jaw.

## 2021-04-15 NOTE — Anesthesia Postprocedure Evaluation (Signed)
Anesthesia Post Note  Patient: Donna Mcconnell  Procedure(s) Performed: RIGHT ONCOPLASTIC BREAST RECONSTRUCTION (Right: Breast) LEFT BREAST REDUCTION  (BREAST) (Left: Breast)     Patient location during evaluation: PACU Anesthesia Type: General Level of consciousness: awake and alert, patient cooperative and oriented Pain management: pain level controlled Vital Signs Assessment: post-procedure vital signs reviewed and stable Respiratory status: spontaneous breathing, nonlabored ventilation and respiratory function stable Cardiovascular status: blood pressure returned to baseline and stable Postop Assessment: no apparent nausea or vomiting and adequate PO intake Anesthetic complications: no   No notable events documented.  Last Vitals:  Vitals:   04/15/21 1055 04/15/21 1110  BP: 133/81 125/72  Pulse: 60 61  Resp: 19 (!) 22  Temp:    SpO2: 97% 98%    Last Pain:  Vitals:   04/15/21 1105  TempSrc:   PainSc: 4                  Kabir Brannock,E. Jakyron Fabro

## 2021-04-15 NOTE — Op Note (Signed)
Operative Note   DATE OF OPERATION: 12.19.22  LOCATION: [facility]-outpatient  SURGICAL DIVISION: Plastic Surgery  PREOPERATIVE DIAGNOSES:  1. Right breast ca UOQ ER+ 2. Macromastia 3. Chronic neck and back pain  POSTOPERATIVE DIAGNOSES:  same  PROCEDURE:  Right oncoplastic breast reconstruction left breast reduction  SURGEON: Irene Limbo MD MBA  ASSISTANT: Gentry Fitz RNFA  ANESTHESIA:  General.   EBL: 50 ml  COMPLICATIONS: None immediate.   INDICATIONS FOR PROCEDURE:  The patient, Donna Mcconnell, is a 59 y.o. female born on [birth year], is here for staged breast reconstruction following right lumpectomy. Patient also has a history chronic neck and back pain in setting macromastia that has failed conservative measures.   FINDINGS: Right reduction 121 g Left reduction 175 g  DESCRIPTION OF PROCEDURE:  The patient was marked standing in the preoperative area to mark sternal notch, chest midline, anterior axillary lines, inframammary folds. The location of new nipple areolar complex was marked at level of on inframammary fold on anterior surface breast by palpation. This was marked symmetric over bilateral breasts. With aid of Wise pattern marker, location of new nipple areolar complex and vertical limbs (7 cm) were marked by displacement of breasts along meridian. The patient was taken to the operating room. SCDs were placed and IV antibiotics were given. The patient's operative site was prepped and draped in a sterile fashion. A time out was performed and all information was confirmed to be correct.     Over right breast, superomedial pedicle marked and nipple areolar complex incised with 45 mm diameter marker. Pedicle deepithlialized and developed to chest wall. Pedicle developed until tension free rotation was possible. Lumpectomy cavity present over superior pole. Breast tissue maintained over lower pole on inferior dermoglandular pedicle to use as autoaugmentation flap.  This tissue was de epithelialized and advanced superiorly beneath pedicle and secured to chest wall with 2-0 PDS figure of eight suture. Medial and lateral flaps developed. Additional superior pole tissue excised.Breast tailor tacked closed.    I then directed attention to left breast where superomedial pedicle designed. NAC marked with 45 mm diameter marker. The pedicle was deepithelialized. Pedicle developed until tension free rotation was possible. Lower pole medial and lateral triangles soft tissue excised. Breast tissue maintained over central lower pole on inferior dermoglandular pedicle to use as autoaugmentation flap. This tissue was de epithelialized and advanced superiorly and medially beneath pedicle and secured to chest wall with 2-0 PDS figure of eight suture. Medial and lateral flaps developed. Additional superior pole tissue excised. Breast tailor tacked closed, and patient brought to upright sitting position and assessed for symmetry. Patent returned to supine position. Breast cavities irrigated and hemostasis obtained. Local anesthetic infiltrated throughout each breast. 15 Fr JP placed in each breast and secured with 2-0 nylon. Closure completed bilateral with 3-0 vicryl to approximate dermis along inframammary fold and vertical limb. NAC inset with 4-0 vicryl in dermis. Skin closure completed with 4-0 monocryl subcuticular throughout. Tissue adhesive applied. Dry dressing and breast binder applied.   The patient was allowed to wake from anesthesia, extubated and taken to the recovery room in satisfactory condition.   SPECIMENS: right and left breast reduction  DRAINS: 15 Fr JP in right and left breast

## 2021-04-15 NOTE — Transfer of Care (Signed)
Immediate Anesthesia Transfer of Care Note  Patient: Donna Mcconnell  Procedure(s) Performed: RIGHT ONCOPLASTIC BREAST RECONSTRUCTION (Right: Breast) LEFT BREAST REDUCTION  (BREAST) (Left: Breast)  Patient Location: PACU  Anesthesia Type:General  Level of Consciousness: drowsy  Airway & Oxygen Therapy: Patient Spontanous Breathing and Patient connected to nasal cannula oxygen  Post-op Assessment: Report given to RN and Post -op Vital signs reviewed and stable  Post vital signs: Reviewed and stable  Last Vitals:  Vitals Value Taken Time  BP 129/65 04/15/21 1022  Temp    Pulse 80 04/15/21 1025  Resp 15 04/15/21 1025  SpO2 100 % 04/15/21 1025  Vitals shown include unvalidated device data.  Last Pain:  Vitals:   04/15/21 0608  TempSrc:   PainSc: 0-No pain         Complications: No notable events documented.

## 2021-04-15 NOTE — H&P (Signed)
Subjective:   Patient ID: Donna Mcconnell is a 60 y.o. female.  HPI  Presents for breast reconstruction. Presented following screening MMG showing a possible abnormality in the right breast. Bilateral diagnostic MMG and right breast US showed 0.8 cm mass in the right breast at the 12 o'clock position, with suspicious calcifications adjacent to the mass spanning 1.1 cm. Biopsy at the 12 o'clock position showed IDC with DCIS and calcifications, ER/PR+, Her2-. Right UIQ biopsy revealed flat epithelial atypia with calcifications and lobular neoplasia.  Patient underwent lumpectomy with final pathology 1.1 cm IDC with ALH, margins clear, 0/3 SLN.  Genetics negative. Oncotype pending.  Current 40 DD. Desires smaller. Wt table.   Lives with spouse. Retired Pharmacist, hospital.   Review of Systems   Objective:  Physical Exam Cardiovascular:  Rate and Rhythm: Normal rate and regular rhythm.  Heart sounds: Normal heart sounds.  Pulmonary:  Effort: Pulmonary effort is normal.  Breath sounds: Normal breath sounds.  Lymphadenopathy:  Upper Body:  Right upper body: No axillary adenopathy.  Left upper body: No axillary adenopathy.    Breasts: Grade 3 ptosis bilateral Sn to nipple R 30 L 30 cm BW R 22 L 22 cm Nipple to IMF R 12 L 12 cm   Assessment:   Right breast ca UOQ ER+ s/p lumpectomy SLN  Plan:    Plan staged oncoplastic reconstruction 7-10 d post lumpectomy to ensure pathologic clearance. Reviewed reduction with anchor type scars, drains, post operative visits and limitations, recovery. Diminished sensation nipple and breast skin, risk of nipple loss, wound healing problems, asymmetry. Discussed will have some contraction of breast volume and increased firmness with radiation, less ptosis with aging. This can result in asymmetries long term. Discussed changes with wt gain, loss, aging. Discussed lumpectomy alone can result in NAC displacement, distortion contour breast following lumpectomy and  RT, asymmetry breast volume and NAC position. Reviewed purpose of this type reconstruction to prevent these. Reviewed breast lift or trying to correct NAC displacement post RT more difficult, higher risk complications. Counseled I cannot assure her cup size.  Reviewed can defer surgery until after therapies complete. In this setting would wait at least 6 months from end RT for any surgery. Reviewed increased risks complications in setting RT. Reviewed any complications from oncoplastic reconstruction procedure may delay start RT.   Additional risks including but not limited to bleeding hematoma seroma infection unacceptable cosmetic appearance need for additional surgery blood clots in legs or lungs damage to adjacent structures reviewed.  Drain teaching completed.

## 2021-04-16 ENCOUNTER — Encounter (HOSPITAL_COMMUNITY): Payer: Self-pay | Admitting: Plastic Surgery

## 2021-04-16 LAB — SURGICAL PATHOLOGY

## 2021-04-23 DIAGNOSIS — C50911 Malignant neoplasm of unspecified site of right female breast: Secondary | ICD-10-CM | POA: Diagnosis not present

## 2021-04-25 ENCOUNTER — Encounter: Payer: Self-pay | Admitting: *Deleted

## 2021-04-25 DIAGNOSIS — Z170 Estrogen receptor positive status [ER+]: Secondary | ICD-10-CM | POA: Diagnosis not present

## 2021-04-25 DIAGNOSIS — C50411 Malignant neoplasm of upper-outer quadrant of right female breast: Secondary | ICD-10-CM | POA: Diagnosis not present

## 2021-04-25 DIAGNOSIS — T8131XD Disruption of external operation (surgical) wound, not elsewhere classified, subsequent encounter: Secondary | ICD-10-CM | POA: Diagnosis not present

## 2021-04-26 ENCOUNTER — Encounter: Payer: Self-pay | Admitting: Hematology

## 2021-04-30 ENCOUNTER — Encounter: Payer: Self-pay | Admitting: *Deleted

## 2021-04-30 ENCOUNTER — Encounter (HOSPITAL_COMMUNITY): Payer: Self-pay

## 2021-04-30 ENCOUNTER — Telehealth: Payer: Self-pay | Admitting: *Deleted

## 2021-04-30 DIAGNOSIS — C50411 Malignant neoplasm of upper-outer quadrant of right female breast: Secondary | ICD-10-CM

## 2021-04-30 NOTE — Telephone Encounter (Signed)
Received oncotype results of 14/4%. Left message for a return phone call. Referral placed for xrt.

## 2021-05-06 ENCOUNTER — Encounter: Payer: Self-pay | Admitting: Physical Therapy

## 2021-05-06 ENCOUNTER — Ambulatory Visit: Payer: BC Managed Care – PPO | Attending: General Surgery | Admitting: Physical Therapy

## 2021-05-06 ENCOUNTER — Other Ambulatory Visit: Payer: Self-pay

## 2021-05-06 DIAGNOSIS — Z483 Aftercare following surgery for neoplasm: Secondary | ICD-10-CM | POA: Insufficient documentation

## 2021-05-06 DIAGNOSIS — Z170 Estrogen receptor positive status [ER+]: Secondary | ICD-10-CM | POA: Insufficient documentation

## 2021-05-06 DIAGNOSIS — C50411 Malignant neoplasm of upper-outer quadrant of right female breast: Secondary | ICD-10-CM | POA: Diagnosis not present

## 2021-05-06 DIAGNOSIS — R293 Abnormal posture: Secondary | ICD-10-CM | POA: Insufficient documentation

## 2021-05-06 NOTE — Patient Instructions (Addendum)
° ° °   [facility]  [address], [address]  [place] [address]  [phone]  After Breast Cancer Class It is recommended you attend the ABC class to be educated on lymphedema risk reduction. This class is free of charge and lasts for 1 hour. It is a 1-time class. You will need to download the Webex app either on your phone or computer. We will send you a link the night before or the morning of the class. You should be able to click on that link to join the class. This is not a confidential class. You don't have to turn your camera on, but other participants may be able to see your email address. You are scheduled for January 16th at 11:00.  Scar massage You can begin gentle scar massage to you incision sites. Gently place one hand on the incision and move the skin (without sliding on the skin) in various directions. Do this for a few minutes and then you can gently massage either coconut oil or vitamin E cream into the scars. LET'S WAIT AND ASK DR. THIMMAPPA ABOT THIS BEFORE YOU BEGIN DOING IT.  Compression garment You should continue wearing your compression bra until you feel like you no longer have swelling.  Home exercise Program Continue doing the exercises you were given until you feel like you can do them without feeling any tightness at the end.   Walking Program Studies show that 30 minutes of walking per day (fast enough to elevate your heart rate) can significantly reduce the risk of a cancer recurrence. If you can't walk due to other medical reasons, we encourage you to find another activity you could do (like a stationary bike or water exercise).  Posture After breast cancer surgery, people frequently sit with rounded shoulders posture because it puts their incisions on slack and feels better. If you sit like this and scar tissue forms in that position, you can become very tight and have pain sitting or standing with good posture. Try to be aware of your  posture and sit and stand up tall to heal properly.  Follow up PT: It is recommended you return every 3 months for the first 3 years following surgery to be assessed on the SOZO machine for an L-Dex score. This helps prevent clinically significant lymphedema in 95% of patients. These follow up screens are 10 minute appointments that you are not billed for. You are scheduled for March 13th at 2:20.  Closed Chain: Shoulder Abduction / Adduction - on Wall    One hand on wall, step to side and return. Stepping causes shoulder to abduct and adduct. Step _5__ times, holding 5 seconds, each side, _2__ times per day.  http://ss.exer.us/267   Copyright  VHI. All rights reserved.  Closed Chain: Shoulder Flexion / Extension - on Wall    Hands on wall, step backward. Return. Stepping causes shoulder flexion and extension Do __5_ times, holding 5 seconds, each foot, _5__ times per day.  http://ss.exer.us/265   Copyright  VHI. All rights reserved.

## 2021-05-06 NOTE — Therapy (Signed)
[facility] [place], [place], [address] Phone: [phone]   Fax:  [phone]  Physical Therapy Treatment  Patient Details  Name: Donna Mcconnell MRN: [id number] Date of Birth: 21-Jul-1960 Referring Provider (PT): Dr. Rolm Bookbinder   Encounter Date: 05/06/2021   PT End of Session - 05/06/21 1515     Visit Number 2    Number of Visits 6    Date for PT Re-Evaluation 06/03/21    PT Start Time 0102    PT Stop Time 1510    PT Time Calculation (min) 65 min    Activity Tolerance Patient tolerated treatment well    Behavior During Therapy [facility] for tasks assessed/performed             Past Medical History:  Diagnosis Date   Allergy    shellfish   Ameloblastoma of jaw    s/p left jaw tumor resection April 2022 with bone graft July 2022 (Dr. Raj Janus)   Breast cancer [facility])    Complication of anesthesia    Coronary atherosclerosis    COVID 03/2019   fever, bodyaches   Fatty liver disease, nonalcoholic    Food allergy    GERD (gastroesophageal reflux disease)    Hyperlipidemia    Hypertension    PONV (postoperative nausea and vomiting)    Spondylosis     Past Surgical History:  Procedure Laterality Date   ADENOIDECTOMY     BREAST LUMPECTOMY WITH RADIOACTIVE SEED AND SENTINEL LYMPH NODE BIOPSY Right 04/04/2021   Procedure: RIGHT BREAST LUMPECTOMY WITH RADIOACTIVE SEED AND AXILLARY SENTINEL LYMPH NODE BIOPSY;  Surgeon: Rolm Bookbinder, MD;  Location: [place];  Service: General;  Laterality: Right;   BREAST RECONSTRUCTION Right 04/15/2021   Procedure: RIGHT ONCOPLASTIC BREAST RECONSTRUCTION;  Surgeon: Irene Limbo, MD;  Location: [place];  Service: Plastics;  Laterality: Right;   BREAST REDUCTION SURGERY Left 04/15/2021   Procedure: LEFT BREAST REDUCTION  (BREAST);  Surgeon: Irene Limbo, MD;  Location: [place];  Service: Plastics;  Laterality: Left;   cercalage     COLONOSCOPY     10 yrs ago    MOUTH SURGERY  03/28/2021   TONSILLECTOMY     TYMPANOSTOMY TUBE PLACEMENT      There were no vitals filed for this visit.   Subjective Assessment - 05/06/21 1403     Subjective Patient reports she underwent a right breast lumpectomy and sentinel node biopsy (3 negative nodes) on 04/04/2021 followed by a bilateral breast reduction on 04/15/2021. Her oncotype was low so no chemo is needed. She will proceed with radiation and anti-estrogen therapy.    Pertinent History Patient was diagnosed on 01/14/2021 with right grade I-II invasive ductal carcinoma breast cancer. She  underwent a right breast lumpectomy and sentinel node biopsy (3 negative nodes) on 04/04/2021. It is ER/PR positive and HER2 negative with a Ki67 of 1%. She has a hx of a mandibular surgery due to a benign tumor in 07/2020 and had to have a bone graft in 10/2020. She is missing multiple teeth on the bottom left side.    Patient Stated Goals See how my arm is doing    Currently in Pain? Yes    Pain Score --   Varies   Pain Location Flank    Pain Orientation Right;Left    Pain Descriptors / Indicators Sore    Pain Type Surgical pain    Pain Onset 1 to 4 weeks ago  Pain Frequency Intermittent   ° Aggravating Factors  Twisting   ° Pain Relieving Factors resting   ° °  °  ° °  ° ° ° ° ° [facility] PT Assessment - 05/06/21 0001   ° °  ° Assessment  ° Medical Diagnosis s/p right lumpectomy, SLNB bil reduction   ° Referring Provider (PT) Dr. Matthew [place]   ° Onset Date/Surgical Date 04/04/21   ° Hand Dominance Right   ° Prior Therapy Baselines   °  ° Precautions  ° Precautions Other (comment)   ° Precaution Comments recent surgery; right arm lymphedema risk   °  ° Restrictions  ° Weight Bearing Restrictions No   °  ° Balance Screen  ° Has the patient fallen in the past 6 months No   ° Has the patient had a decrease in activity level because of a fear of falling?  No   ° Is the patient reluctant to leave their home because of a fear of  falling?  No   °  ° Home Environment  ° Living Environment Private residence   ° Living Arrangements Spouse/significant other   ° Available Help at Discharge Family   °  ° Prior Function  ° Level of Independence Independent   ° Vocation Retired   ° Leisure She is walking at least 3x/week for about a mile   °  ° Cognition  ° Overall Cognitive Status Within Functional Limits for tasks assessed   °  ° Observation/Other Assessments  ° Observations Both breast incisions area healing very well with no signs of infection. Tightness present in breast incisions at lateral aspects which appear to be limiting trunk rotation.   °  ° Posture/Postural Control  ° Posture/Postural Control Postural limitations   ° Postural Limitations Rounded Shoulders;Forward head   °  ° ROM / Strength  ° AROM / PROM / Strength AROM   °  ° AROM  ° Overall AROM Comments trunk rotation limited by pain on bil flank   ° AROM Assessment Site Shoulder   ° Right/Left Shoulder Right;Left   ° Right Shoulder Extension 50 Degrees   ° Right Shoulder Flexion 140 Degrees   ° Right Shoulder ABduction 147 Degrees   ° Right Shoulder Internal Rotation 75 Degrees   ° Right Shoulder External Rotation 80 Degrees   ° Left Shoulder Extension 56 Degrees   ° Left Shoulder Flexion 136 Degrees   ° Left Shoulder ABduction 152 Degrees   ° Left Shoulder Internal Rotation 76 Degrees   ° Left Shoulder External Rotation 83 Degrees   °  ° Strength  ° Overall Strength Unable to assess;Due to precautions   ° °  °  ° °  ° ° ° ° LYMPHEDEMA/ONCOLOGY QUESTIONNAIRE - 05/06/21 0001   ° °  ° Type  ° Cancer Type Right breast cancer   °  ° Surgeries  ° Lumpectomy Date 04/04/21   ° Sentinel Lymph Node Biopsy Date 04/04/21   ° Number Lymph Nodes Removed 3   °  ° Treatment  ° Active Chemotherapy Treatment No   ° Past Chemotherapy Treatment No   ° Active Radiation Treatment No   ° Past Radiation Treatment No   ° Current Hormone Treatment No   ° Past Hormone Therapy No   °  ° What other symptoms  do you have  ° Are you Having Heaviness or Tightness Yes   ° Are you having Pain Yes   ° Are you   having pitting edema No    Is it Hard or Difficult finding clothes that fit No    Do you have infections No    Is there Decreased scar mobility Yes    [place]mer Sign No      Lymphedema Assessments   Lymphedema Assessments Upper extremities      Right Upper Extremity Lymphedema   10 cm Proximal to Olecranon Process 32.7 cm    Olecranon Process 26.8 cm    10 cm Proximal to Ulnar Styloid Process 22.8 cm    Just Proximal to Ulnar Styloid Process 14.6 cm    Across Hand at [employer] 18.1 cm    At [place] of 2nd Digit 6 cm      Left Upper Extremity Lymphedema   10 cm Proximal to Olecranon Process 33.9 cm    Olecranon Process 25.6 cm    10 cm Proximal to Ulnar Styloid Process 22.3 cm    Just Proximal to Ulnar Styloid Process 14.3 cm    Across Hand at [employer] 17.7 cm    At [place] of 2nd Digit 6.2 cm                Quick Dash - 05/06/21 0001     Open a tight or new jar No difficulty    Do heavy household chores (wash walls, wash floors) Unable    Carry a shopping bag or briefcase No difficulty    Use a knife to cut food No difficulty    Recreational activities in which you take some force or impact through your arm, shoulder, or hand (golf, hammering, tennis) No difficulty    During the past week, to what extent has your arm, shoulder or hand problem interfered with your normal social activities with family, friends, neighbors, or groups? Not at all    During the past week, to what extent has your arm, shoulder or hand problem limited your work or other regular daily activities Not at all    Arm, shoulder, or hand pain. None    Tingling (pins and needles) in your arm, shoulder, or hand None    Difficulty Sleeping No difficulty    DASH Score 6.82 %                    [facility] Adult PT Treatment/Exercise - 05/06/21 0001       Exercises   Exercises Lumbar      Lumbar  Exercises: Supine   Other Supine Lumbar Exercises Attempted sidelying to reach overhead and stretch the lateral trunk. Pt c/o mid back pain with this, laid supine, and then her pain disappeared and her muscles relaxed.                     PT Education - 05/06/21 1515     Education Details Aftercare; lymphedema education    Person(s) Educated Patient    Methods Explanation;Demonstration;Handout    Comprehension Returned demonstration;Verbalized understanding                 PT Long Term Goals - 05/06/21 1524       PT LONG TERM GOAL #1   Title Patient will demonstrate she has regained full shoulder ROM and function post operatively compared to baselines.    Time 4    Period Weeks    Status On-going    Target Date 06/03/21      PT LONG TERM GOAL #2   Title Patient  will report >/= 50% less pain and tightness with trunk rotation activities such as reaching her her light on her night stand.   ° Time 4   ° Period Weeks   ° Status New   ° Target Date 06/03/21   °  ° PT LONG TERM GOAL #3  ° Title Patient will be independent in scar tissue massage for reducing tissue tightness.   ° Time 4   ° Period Weeks   ° Status New   ° Target Date 06/03/21   ° °  °  ° °  ° ° ° ° ° ° ° ° Plan - 05/06/21 1516   ° ° Clinical Impression Statement Patient is healing well s/p right lumpectomy and sentinel node biopsy (3 negative nodes) on 04/04/2021 followed by a bilateral breast reduction and lift on 04/15/2021. Her incision appear to be healing very well with no signs of redness or swelling. She has regained nearly full shoulder ROM (lacking about 10 degrees of shoulder flexion and abduction bilaterally), shows no signs of lymphedema, and appears to be functionally back to baseline except for her lifting restriction. She has some scar tissue tightness bilaterally on the lateral aspect of her inferior breast incisions that appear to be limiting trunk rotation. She may benefit from PT to adresss the  scar tissue tightness but we will wait until she sees Dr. Thimmappa on 05/15/2021 to get clearance for that.   ° PT Frequency 2x / week   ° PT Duration 2 weeks   Beginning 05/20/2021  ° PT Treatment/Interventions ADLs/Self Care Home Management;Therapeutic exercise;Patient/family education;Scar mobilization;Passive range of motion;Manual techniques   ° PT Next Visit Plan Scar massage to lateral breast incisions to promote trunk rotation and twisting   ° PT Home Exercise Plan Post op HEP and closed chain flexion and abduction   ° Consulted and Agree with Plan of Care Patient   ° °  °  ° °  ° ° °Patient will benefit from skilled therapeutic intervention in order to improve the following deficits and impairments:  Postural dysfunction, Decreased range of motion, Decreased knowledge of precautions, Impaired UE functional use, Pain, Decreased scar mobility ° °Visit Diagnosis: °Malignant neoplasm of upper-outer quadrant of right breast in female, estrogen receptor positive (HCC) - Plan: PT plan of care cert/re-cert ° °Abnormal posture - Plan: PT plan of care cert/re-cert ° °Aftercare following surgery for neoplasm - Plan: PT plan of care cert/re-cert ° ° ° ° °Problem List °Patient Active Problem List  ° Diagnosis Date Noted  ° Genetic testing 03/05/2021  ° Family history of breast cancer 02/20/2021  ° Family history of prostate cancer 02/20/2021  ° Malignant neoplasm of upper-outer quadrant of right breast in female, estrogen receptor positive (HCC) 02/15/2021  ° °Marti Cooper Smith, PT °05/06/21 4:51 PM ° ° °[place] °[facility] °[address] °[place], [place], [address] °Phone: [phone]   Fax:  [phone] ° °Name: Ninoshka W Kozloski °MRN: [id number] °Date of Birth: 02/07/1961 ° ° ° °

## 2021-05-08 NOTE — Progress Notes (Signed)
Location of Breast Cancer: upper-outer quadrant of right breast   Histology per Pathology Report:  1. Breast, right, needle core biopsy, 12 o'clock, ribbon shaped clip - INVASIVE DUCTAL CARCINOMA - DUCTAL CARCINOMA IN SITU - CALCIFICATIONS - SEE COMMENT 2. Breast, right, needle core biopsy, upper inner quadrant, x shaped clip - FLAT EPITHELIAL ATYPIA WITH CALCIFICATIONS - LOBULAR NEOPLASIA (ATYPICAL LOBULAR HYPERPLASIA)  A. BREAST, RIGHT, LUMPECTOMY:  - Invasive ductal carcinoma, 1.1 cm, grade 1  - Ductal carcinoma in situ, low-grade  - Resection margins are negative for carcinoma - closest is the anterior  margin at 0.8 cm  - Atypical lobular hyperplasia  - Biopsy site changes  - See oncology table   B. LYMPH NODE, RIGHT AXILLA, SENTINEL, EXCISION:  - Lymph node, negative for carcinoma (0/1)   C. LYMPH NODE, RIGHT AXILLA, SENTINEL, EXCISION:  - Lymph node, negative for carcinoma (0/1)   D. LYMPH NODE, RIGHT AXILLA, SENTINEL, EXCISION:  - Lymph node, negative for carcinoma (0/1)   E. BREAST, RIGHT POSTERIOR MARGIN, EXCISION:  - Atypical lobular hyperplasia   Receptor Status:  Estrogen Receptor: >95%, positive, strong staining intensity             Progesterone Receptor: >95%, positive, strong staining  intensity             HER2: Negative (FISH)             Ki-67: 1%   Did patient present with symptoms (if so, please note symptoms) or was this found on screening mammography?: -found on screening mammogram.  Past/Anticipated interventions by surgeon, if any:  Procedure: 1.  Right breast radioactive seed guided lumpectomy 2.  Right deep axillary sentinel lymph node biopsy 3.  Injection of mag trace for sentinel lymph node identification Surgeon: Dr. Serita Grammes  Past/Anticipated interventions by medical oncology, if any: Dr Burr Medico -I will see her back around the last week of her radiation to finalize her antiestrogen therapy plan, or sooner if Oncytype shows high risk  disease.  Lymphedema issues, if any:  no    Pain issues, if any:  yes, tenderness from breast reduction intermittent  SAFETY ISSUES: Prior radiation? no Pacemaker/ICD? no Possible current pregnancy?no, postmenopausal Is the patient on methotrexate? no  Current Complaints / other details:  none    Vitals:   05/15/21 1305  BP: 106/71  Pulse: (!) 111  Resp: 18  Temp: (!) 96.3 F (35.7 C)  TempSrc: Temporal  SpO2: 97%  Weight: 188 lb 8 oz (85.5 kg)  Height: _0  (1.575 m)

## 2021-05-13 DIAGNOSIS — E785 Hyperlipidemia, unspecified: Secondary | ICD-10-CM | POA: Diagnosis not present

## 2021-05-14 NOTE — Progress Notes (Signed)
Radiation Oncology         [phone] ________________________________  Name: Donna Mcconnell MRN: [id number]  Date: 05/15/2021  DOB: 12-30-1960  Re-Evaluation Note  CC: Shon Baton, MD  Truitt Merle, MD    ICD-10-CM   1. Malignant neoplasm of upper-outer quadrant of right breast in female, estrogen receptor positive ([place])  C50.411    Z17.0       Diagnosis:   Stage IA (cT1b, cN0, cM0) Right Breast UOQ, Invasive ductal carcinoma and low-grade DCIS, ER+ / PR+ / Her2-, Grade 1  Narrative:  The patient returns today to discuss radiation treatment options. She was seen in the multidisciplinary breast clinic on 02/20/21.   Since consultation, she underwent genetic testing on 02/20/21. Results showed no clinically significant variants detected from +RNAinsight testing.  On her breast clinic consultation date, the patient met with Dr. Burr Medico to discuss treatment options. Given the patient's strong ER and PR expression and her postmenopausal status, Dr. Burr Medico recommend adjuvant endocrine therapy with aromatase inhibitor for a total of 5-10 years to reduce the risk of cancer recurrence. Dr. Burr Medico also ordered Oncotype testing to assess the role of adjuvant chemotherapy.  She opted to proceed with right breast lumpectomy with nodal biopsies on 04/04/21 under the care of Dr. Donne Hazel. Pathology from the procedure revealed: grade 1 invasive ductal carcinoma measuring 1.1 cm, and low-grade ductal carcinoma in-situ. All resection margins negative for carcinoma; with the closest being the anterior margin at 0.8 cm (right posterior margin excision revealed atypical lobular hyperplasia).  Nodal status of 3/3 right axillary sentinel lymph node excisions negative for carcinoma.  Prognostic indicators significant for : ER status >95% positive; PR status >95% positive, both with strong staining intensity; Her2 status negative; Proliferation marker Ki67 at 1%; Grade 1.   Oncotype DX was obtained on the final surgical  sample and the recurrence score of 14 predicts a risk of recurrence outside the breast over the next 9 years of 4%, if the patient's only systemic therapy is an antiestrogen for 5 years.  It also predicts no significant benefit from chemotherapy.  The patient also underwent bilateral mammoplasties on 04/15/21 under the care of Dr. Iran Planas. Pathology from the procedure revealed: benign mammary parenchyma with fat necrosis and giant cell reaction from right mammoplasty, and benign mammary parenchyma from left mammoplasty. Both mammoplasties were negative for atypical proliferative breast disease.   In recent history, the patient met with Dr. Donne Hazel on 04/30/21 for a post-op check-up. During which time, the patient was noted to be healing well from both of her procedures.  She saw Dr. Iran Planas earlier today and has been cleared to proceed with radiation therapy.  On review of systems, the patient reports no pain within the right breast area. She denies swelling in her right arm or hand.  She does report some numbness along her axillary scar.   Allergies:  is allergic to shellfish allergy.  Meds: Current Outpatient Medications  Medication Sig Dispense Refill   ASTAXANTHIN PO Take 16 mg by mouth daily.     CALCIUM-VITAMIN D PO Take 1 tablet by mouth daily.     chlorhexidine (PERIDEX) 0.12 % solution 15 mLs by Mouth Rinse route 2 (two) times daily.     ibuprofen (ADVIL) 200 MG tablet Take 400 mg by mouth every 6 (six) hours as needed.     pantoprazole (PROTONIX) 40 MG tablet Take 40 mg by mouth daily.     valsartan (DIOVAN) 80 MG tablet Take 80 mg  by mouth daily.     acetaminophen (TYLENOL) 500 MG tablet Take 1,000 mg by mouth every 6 (six) hours as needed.     Multiple Vitamin (MULTIVITAMIN WITH MINERALS) TABS tablet Take 1 tablet by mouth daily. (Patient not taking: Reported on 05/15/2021)     oxyCODONE (OXY IR/ROXICODONE) 5 MG immediate release tablet Take 1 tablet (5 mg total) by mouth  every 6 (six) hours as needed for moderate pain, severe pain or breakthrough pain. 10 tablet 0   No current facility-administered medications for this encounter.    Physical Findings: The patient is in no acute distress. Patient is alert and oriented.  height is 5' 2"  (1.575 m) and weight is 188 lb 8 oz (85.5 kg). Her temporal temperature is 96.3 F (35.7 C) (abnormal). Her blood pressure is 106/71 and her pulse is 111 (abnormal). Her respiration is 18 and oxygen saturation is 97%.  No significant changes. Lungs are clear to auscultation bilaterally. Heart has regular rate and rhythm. No palpable cervical, supraclavicular, or axillary adenopathy. Abdomen soft, non-tender, normal bowel sounds. Left breast: no palpable mass, nipple discharge or bleeding.  Well-healing scars from her reduction mammoplasty Right breast: Well-healing scars from her oncoplastic breast reconstruction.  A separate scar in the axillary region also healed well without signs of drainage or infection.  No separate lumpectomy scar is appreciated.  Lab Findings: Lab Results  Component Value Date   WBC 5.6 03/28/2021   HGB 13.9 03/28/2021   HCT 44.2 03/28/2021   MCV 96.1 03/28/2021   PLT 310 03/28/2021    Radiographic Findings: No results found.  Impression: Stage IA (cT1b, cN0, cM0) Right Breast UOQ, Invasive ductal carcinoma and low-grade DCIS, ER+ / PR+ / Her2-, Grade 1  She would be a good candidate for adjuvant radiation therapy to reduce the chances of recurrence within the right breast.  I discussed the general course of radiation therapy, anticipated side effects and potential long-term toxicities with right breast radiation therapy with the patient and her husband.  She appears to understand and wishes to proceed with planned course of treatment.  Since she has had extensive surgery along the right side I would recommend conventional radiation therapy as opposed to hypofractionated treatment.  She is in  agreement.   Plan:  Patient is scheduled for CT simulation later today.  Treatments to begin in approximately a week.  Anticipate between 5-1/2 tp 6 and half weeks of radiation therapy.  We will boost the lumpectomy cavity if this can be identified but may not be possible given her oncoplastic breast reconstruction.  -----------------------------------  Blair Promise, PhD, MD  This document serves as a record of services personally performed by Gery Pray, MD. It was created on his behalf by Roney Mans, a trained medical scribe. The creation of this record is based on the scribe's personal observations and the provider's statements to them. This document has been checked and approved by the attending provider.

## 2021-05-15 ENCOUNTER — Ambulatory Visit
Admission: RE | Admit: 2021-05-15 | Discharge: 2021-05-15 | Disposition: A | Discharge status: Home / Self Care | Payer: BC Managed Care – PPO | Source: Ambulatory Visit | Attending: Radiation Oncology | Admitting: Radiation Oncology

## 2021-05-15 ENCOUNTER — Encounter: Payer: Self-pay | Admitting: Radiation Oncology

## 2021-05-15 ENCOUNTER — Other Ambulatory Visit: Payer: Self-pay

## 2021-05-15 VITALS — BP 106/71 | HR 111 | Temp 96.3°F | Resp 18 | Ht 62.0 in | Wt 188.5 lb

## 2021-05-15 DIAGNOSIS — R200 Anesthesia of skin: Secondary | ICD-10-CM | POA: Diagnosis not present

## 2021-05-15 DIAGNOSIS — Z170 Estrogen receptor positive status [ER+]: Secondary | ICD-10-CM

## 2021-05-15 DIAGNOSIS — Z510 Encounter for antineoplastic radiation therapy: Secondary | ICD-10-CM | POA: Diagnosis not present

## 2021-05-15 DIAGNOSIS — Z79899 Other long term (current) drug therapy: Secondary | ICD-10-CM | POA: Diagnosis not present

## 2021-05-15 DIAGNOSIS — C50411 Malignant neoplasm of upper-outer quadrant of right female breast: Secondary | ICD-10-CM | POA: Insufficient documentation

## 2021-05-15 NOTE — Progress Notes (Signed)
See MD note for nursing evaluation. °

## 2021-05-17 DIAGNOSIS — C50919 Malignant neoplasm of unspecified site of unspecified female breast: Secondary | ICD-10-CM | POA: Diagnosis not present

## 2021-05-20 ENCOUNTER — Encounter: Payer: Self-pay | Admitting: Rehabilitation

## 2021-05-20 DIAGNOSIS — Z1331 Encounter for screening for depression: Secondary | ICD-10-CM | POA: Diagnosis not present

## 2021-05-20 DIAGNOSIS — I10 Essential (primary) hypertension: Secondary | ICD-10-CM | POA: Diagnosis not present

## 2021-05-20 DIAGNOSIS — Z1339 Encounter for screening examination for other mental health and behavioral disorders: Secondary | ICD-10-CM | POA: Diagnosis not present

## 2021-05-20 DIAGNOSIS — R82998 Other abnormal findings in urine: Secondary | ICD-10-CM | POA: Diagnosis not present

## 2021-05-20 DIAGNOSIS — Z0000 Encounter for general adult medical examination without abnormal findings: Secondary | ICD-10-CM | POA: Diagnosis not present

## 2021-05-21 ENCOUNTER — Encounter: Payer: Self-pay | Admitting: *Deleted

## 2021-05-21 DIAGNOSIS — Z510 Encounter for antineoplastic radiation therapy: Secondary | ICD-10-CM | POA: Diagnosis not present

## 2021-05-21 DIAGNOSIS — Z170 Estrogen receptor positive status [ER+]: Secondary | ICD-10-CM | POA: Diagnosis not present

## 2021-05-21 DIAGNOSIS — C50411 Malignant neoplasm of upper-outer quadrant of right female breast: Secondary | ICD-10-CM | POA: Diagnosis not present

## 2021-05-22 ENCOUNTER — Ambulatory Visit
Admission: RE | Admit: 2021-05-22 | Payer: BC Managed Care – PPO | Source: Ambulatory Visit | Admitting: Radiation Oncology

## 2021-05-22 ENCOUNTER — Ambulatory Visit: Payer: BC Managed Care – PPO | Admitting: Radiation Oncology

## 2021-05-22 ENCOUNTER — Other Ambulatory Visit: Payer: Self-pay

## 2021-05-23 ENCOUNTER — Ambulatory Visit
Admission: RE | Admit: 2021-05-23 | Discharge: 2021-05-23 | Disposition: A | Discharge status: Home / Self Care | Payer: BC Managed Care – PPO | Source: Ambulatory Visit | Attending: Radiation Oncology | Admitting: Radiation Oncology

## 2021-05-23 ENCOUNTER — Ambulatory Visit: Payer: BC Managed Care – PPO | Admitting: Radiation Oncology

## 2021-05-23 DIAGNOSIS — Z170 Estrogen receptor positive status [ER+]: Secondary | ICD-10-CM | POA: Diagnosis not present

## 2021-05-23 DIAGNOSIS — Z510 Encounter for antineoplastic radiation therapy: Secondary | ICD-10-CM | POA: Diagnosis not present

## 2021-05-23 DIAGNOSIS — C50411 Malignant neoplasm of upper-outer quadrant of right female breast: Secondary | ICD-10-CM | POA: Diagnosis not present

## 2021-05-24 ENCOUNTER — Ambulatory Visit
Admission: RE | Admit: 2021-05-24 | Discharge: 2021-05-24 | Disposition: A | Discharge status: Home / Self Care | Payer: BC Managed Care – PPO | Source: Ambulatory Visit | Attending: Radiation Oncology | Admitting: Radiation Oncology

## 2021-05-24 ENCOUNTER — Other Ambulatory Visit: Payer: Self-pay

## 2021-05-24 DIAGNOSIS — Z170 Estrogen receptor positive status [ER+]: Secondary | ICD-10-CM | POA: Diagnosis not present

## 2021-05-24 DIAGNOSIS — C50411 Malignant neoplasm of upper-outer quadrant of right female breast: Secondary | ICD-10-CM | POA: Diagnosis not present

## 2021-05-24 DIAGNOSIS — Z510 Encounter for antineoplastic radiation therapy: Secondary | ICD-10-CM | POA: Diagnosis not present

## 2021-05-27 ENCOUNTER — Other Ambulatory Visit: Payer: Self-pay

## 2021-05-27 ENCOUNTER — Ambulatory Visit
Admission: RE | Admit: 2021-05-27 | Discharge: 2021-05-27 | Disposition: A | Discharge status: Home / Self Care | Payer: BC Managed Care – PPO | Source: Ambulatory Visit | Attending: Radiation Oncology | Admitting: Radiation Oncology

## 2021-05-27 DIAGNOSIS — C50411 Malignant neoplasm of upper-outer quadrant of right female breast: Secondary | ICD-10-CM | POA: Diagnosis not present

## 2021-05-27 DIAGNOSIS — Z170 Estrogen receptor positive status [ER+]: Secondary | ICD-10-CM | POA: Diagnosis not present

## 2021-05-27 DIAGNOSIS — Z510 Encounter for antineoplastic radiation therapy: Secondary | ICD-10-CM | POA: Diagnosis not present

## 2021-05-28 ENCOUNTER — Ambulatory Visit
Admission: RE | Admit: 2021-05-28 | Discharge: 2021-05-28 | Disposition: A | Discharge status: Home / Self Care | Payer: BC Managed Care – PPO | Source: Ambulatory Visit | Attending: Radiation Oncology | Admitting: Radiation Oncology

## 2021-05-28 DIAGNOSIS — C50411 Malignant neoplasm of upper-outer quadrant of right female breast: Secondary | ICD-10-CM

## 2021-05-28 DIAGNOSIS — Z170 Estrogen receptor positive status [ER+]: Secondary | ICD-10-CM | POA: Diagnosis not present

## 2021-05-28 DIAGNOSIS — Z510 Encounter for antineoplastic radiation therapy: Secondary | ICD-10-CM | POA: Diagnosis not present

## 2021-05-28 MED ORDER — RADIAPLEXRX EX GEL: Freq: Once | CUTANEOUS | Status: AC

## 2021-05-28 NOTE — Progress Notes (Signed)
Pt here for patient teaching.    Pt given Radiation and You booklet, skin care instructions, and Radiaplex gel.    Reviewed areas of pertinence such as fatigue, hair loss, skin changes, breast tenderness, and breast swelling .   Pt able to give teach back of to pat skin and use unscented/gentle soap,apply Radiaplex bid, avoid applying anything to skin within 4 hours of treatment, avoid wearing an under wire bra, and to use an electric razor if they must shave.   Pt verbalizes understanding of information given and will contact nursing with any questions or concerns.

## 2021-05-29 ENCOUNTER — Other Ambulatory Visit: Payer: Self-pay

## 2021-05-29 ENCOUNTER — Ambulatory Visit
Admission: RE | Admit: 2021-05-29 | Discharge: 2021-05-29 | Disposition: A | Discharge status: Home / Self Care | Payer: BC Managed Care – PPO | Source: Ambulatory Visit | Attending: Radiation Oncology | Admitting: Radiation Oncology

## 2021-05-29 ENCOUNTER — Telehealth: Payer: Self-pay | Admitting: Hematology

## 2021-05-29 DIAGNOSIS — Z170 Estrogen receptor positive status [ER+]: Secondary | ICD-10-CM | POA: Diagnosis not present

## 2021-05-29 DIAGNOSIS — M272 Inflammatory conditions of jaws: Secondary | ICD-10-CM | POA: Diagnosis not present

## 2021-05-29 DIAGNOSIS — T8131XD Disruption of external operation (surgical) wound, not elsewhere classified, subsequent encounter: Secondary | ICD-10-CM | POA: Diagnosis not present

## 2021-05-29 DIAGNOSIS — Z510 Encounter for antineoplastic radiation therapy: Secondary | ICD-10-CM | POA: Diagnosis not present

## 2021-05-29 DIAGNOSIS — D165 Benign neoplasm of lower jaw bone: Secondary | ICD-10-CM | POA: Diagnosis not present

## 2021-05-29 DIAGNOSIS — C50411 Malignant neoplasm of upper-outer quadrant of right female breast: Secondary | ICD-10-CM | POA: Insufficient documentation

## 2021-05-29 NOTE — Telephone Encounter (Signed)
Sch per 1.24 inbasket, left msg

## 2021-05-30 ENCOUNTER — Ambulatory Visit
Admission: RE | Admit: 2021-05-30 | Discharge: 2021-05-30 | Disposition: A | Discharge status: Home / Self Care | Payer: BC Managed Care – PPO | Source: Ambulatory Visit | Attending: Radiation Oncology | Admitting: Radiation Oncology

## 2021-05-30 DIAGNOSIS — Z510 Encounter for antineoplastic radiation therapy: Secondary | ICD-10-CM | POA: Diagnosis not present

## 2021-05-30 DIAGNOSIS — Z170 Estrogen receptor positive status [ER+]: Secondary | ICD-10-CM | POA: Diagnosis not present

## 2021-05-30 DIAGNOSIS — C50411 Malignant neoplasm of upper-outer quadrant of right female breast: Secondary | ICD-10-CM | POA: Diagnosis not present

## 2021-05-31 ENCOUNTER — Other Ambulatory Visit: Payer: Self-pay

## 2021-05-31 ENCOUNTER — Ambulatory Visit
Admission: RE | Admit: 2021-05-31 | Discharge: 2021-05-31 | Disposition: A | Discharge status: Home / Self Care | Payer: BC Managed Care – PPO | Source: Ambulatory Visit | Attending: Radiation Oncology | Admitting: Radiation Oncology

## 2021-05-31 DIAGNOSIS — Z510 Encounter for antineoplastic radiation therapy: Secondary | ICD-10-CM | POA: Diagnosis not present

## 2021-05-31 DIAGNOSIS — Z170 Estrogen receptor positive status [ER+]: Secondary | ICD-10-CM | POA: Diagnosis not present

## 2021-05-31 DIAGNOSIS — C50411 Malignant neoplasm of upper-outer quadrant of right female breast: Secondary | ICD-10-CM | POA: Diagnosis not present

## 2021-06-03 ENCOUNTER — Ambulatory Visit
Admission: RE | Admit: 2021-06-03 | Discharge: 2021-06-03 | Disposition: A | Discharge status: Home / Self Care | Payer: BC Managed Care – PPO | Source: Ambulatory Visit | Attending: Radiation Oncology | Admitting: Radiation Oncology

## 2021-06-03 ENCOUNTER — Other Ambulatory Visit: Payer: Self-pay

## 2021-06-03 DIAGNOSIS — Z510 Encounter for antineoplastic radiation therapy: Secondary | ICD-10-CM | POA: Diagnosis not present

## 2021-06-03 DIAGNOSIS — Z170 Estrogen receptor positive status [ER+]: Secondary | ICD-10-CM | POA: Diagnosis not present

## 2021-06-03 DIAGNOSIS — C50411 Malignant neoplasm of upper-outer quadrant of right female breast: Secondary | ICD-10-CM | POA: Diagnosis not present

## 2021-06-04 ENCOUNTER — Ambulatory Visit
Admission: RE | Admit: 2021-06-04 | Discharge: 2021-06-04 | Disposition: A | Discharge status: Home / Self Care | Payer: BC Managed Care – PPO | Source: Ambulatory Visit | Attending: Radiation Oncology | Admitting: Radiation Oncology

## 2021-06-04 DIAGNOSIS — C50411 Malignant neoplasm of upper-outer quadrant of right female breast: Secondary | ICD-10-CM | POA: Diagnosis not present

## 2021-06-04 DIAGNOSIS — Z510 Encounter for antineoplastic radiation therapy: Secondary | ICD-10-CM | POA: Diagnosis not present

## 2021-06-04 DIAGNOSIS — Z170 Estrogen receptor positive status [ER+]: Secondary | ICD-10-CM | POA: Diagnosis not present

## 2021-06-05 ENCOUNTER — Other Ambulatory Visit: Payer: Self-pay

## 2021-06-05 ENCOUNTER — Ambulatory Visit
Admission: RE | Admit: 2021-06-05 | Discharge: 2021-06-05 | Disposition: A | Discharge status: Home / Self Care | Payer: BC Managed Care – PPO | Source: Ambulatory Visit | Attending: Radiation Oncology | Admitting: Radiation Oncology

## 2021-06-05 DIAGNOSIS — Z170 Estrogen receptor positive status [ER+]: Secondary | ICD-10-CM | POA: Diagnosis not present

## 2021-06-05 DIAGNOSIS — C50411 Malignant neoplasm of upper-outer quadrant of right female breast: Secondary | ICD-10-CM | POA: Diagnosis not present

## 2021-06-05 DIAGNOSIS — Z510 Encounter for antineoplastic radiation therapy: Secondary | ICD-10-CM | POA: Diagnosis not present

## 2021-06-06 ENCOUNTER — Ambulatory Visit
Admission: RE | Admit: 2021-06-06 | Discharge: 2021-06-06 | Disposition: A | Discharge status: Home / Self Care | Payer: BC Managed Care – PPO | Source: Ambulatory Visit | Attending: Radiation Oncology | Admitting: Radiation Oncology

## 2021-06-06 DIAGNOSIS — Z510 Encounter for antineoplastic radiation therapy: Secondary | ICD-10-CM | POA: Diagnosis not present

## 2021-06-06 DIAGNOSIS — C50411 Malignant neoplasm of upper-outer quadrant of right female breast: Secondary | ICD-10-CM | POA: Diagnosis not present

## 2021-06-06 DIAGNOSIS — Z170 Estrogen receptor positive status [ER+]: Secondary | ICD-10-CM | POA: Diagnosis not present

## 2021-06-07 ENCOUNTER — Other Ambulatory Visit: Payer: Self-pay

## 2021-06-07 ENCOUNTER — Ambulatory Visit
Admission: RE | Admit: 2021-06-07 | Discharge: 2021-06-07 | Disposition: A | Discharge status: Home / Self Care | Payer: BC Managed Care – PPO | Source: Ambulatory Visit | Attending: Radiation Oncology | Admitting: Radiation Oncology

## 2021-06-07 DIAGNOSIS — C50411 Malignant neoplasm of upper-outer quadrant of right female breast: Secondary | ICD-10-CM | POA: Diagnosis not present

## 2021-06-07 DIAGNOSIS — Z510 Encounter for antineoplastic radiation therapy: Secondary | ICD-10-CM | POA: Diagnosis not present

## 2021-06-07 DIAGNOSIS — Z170 Estrogen receptor positive status [ER+]: Secondary | ICD-10-CM | POA: Diagnosis not present

## 2021-06-10 ENCOUNTER — Ambulatory Visit
Admission: RE | Admit: 2021-06-10 | Discharge: 2021-06-10 | Disposition: A | Discharge status: Home / Self Care | Payer: BC Managed Care – PPO | Source: Ambulatory Visit | Attending: Radiation Oncology | Admitting: Radiation Oncology

## 2021-06-10 DIAGNOSIS — Z170 Estrogen receptor positive status [ER+]: Secondary | ICD-10-CM | POA: Diagnosis not present

## 2021-06-10 DIAGNOSIS — Z510 Encounter for antineoplastic radiation therapy: Secondary | ICD-10-CM | POA: Diagnosis not present

## 2021-06-10 DIAGNOSIS — C50411 Malignant neoplasm of upper-outer quadrant of right female breast: Secondary | ICD-10-CM | POA: Diagnosis not present

## 2021-06-11 ENCOUNTER — Other Ambulatory Visit: Payer: Self-pay

## 2021-06-11 ENCOUNTER — Ambulatory Visit
Admission: RE | Admit: 2021-06-11 | Discharge: 2021-06-11 | Disposition: A | Discharge status: Home / Self Care | Payer: BC Managed Care – PPO | Source: Ambulatory Visit | Attending: Radiation Oncology | Admitting: Radiation Oncology

## 2021-06-11 DIAGNOSIS — Z510 Encounter for antineoplastic radiation therapy: Secondary | ICD-10-CM | POA: Diagnosis not present

## 2021-06-11 DIAGNOSIS — C50411 Malignant neoplasm of upper-outer quadrant of right female breast: Secondary | ICD-10-CM | POA: Diagnosis not present

## 2021-06-11 DIAGNOSIS — Z170 Estrogen receptor positive status [ER+]: Secondary | ICD-10-CM | POA: Diagnosis not present

## 2021-06-12 ENCOUNTER — Ambulatory Visit
Admission: RE | Admit: 2021-06-12 | Discharge: 2021-06-12 | Disposition: A | Discharge status: Home / Self Care | Payer: BC Managed Care – PPO | Source: Ambulatory Visit | Attending: Radiation Oncology | Admitting: Radiation Oncology

## 2021-06-12 DIAGNOSIS — Z510 Encounter for antineoplastic radiation therapy: Secondary | ICD-10-CM | POA: Diagnosis not present

## 2021-06-12 DIAGNOSIS — Z170 Estrogen receptor positive status [ER+]: Secondary | ICD-10-CM | POA: Diagnosis not present

## 2021-06-12 DIAGNOSIS — C50411 Malignant neoplasm of upper-outer quadrant of right female breast: Secondary | ICD-10-CM | POA: Diagnosis not present

## 2021-06-13 ENCOUNTER — Ambulatory Visit
Admission: RE | Admit: 2021-06-13 | Discharge: 2021-06-13 | Disposition: A | Discharge status: Home / Self Care | Payer: BC Managed Care – PPO | Source: Ambulatory Visit | Attending: Radiation Oncology | Admitting: Radiation Oncology

## 2021-06-13 ENCOUNTER — Other Ambulatory Visit: Payer: Self-pay

## 2021-06-13 DIAGNOSIS — Z170 Estrogen receptor positive status [ER+]: Secondary | ICD-10-CM | POA: Diagnosis not present

## 2021-06-13 DIAGNOSIS — C50411 Malignant neoplasm of upper-outer quadrant of right female breast: Secondary | ICD-10-CM | POA: Diagnosis not present

## 2021-06-13 DIAGNOSIS — Z510 Encounter for antineoplastic radiation therapy: Secondary | ICD-10-CM | POA: Diagnosis not present

## 2021-06-14 ENCOUNTER — Ambulatory Visit
Admission: RE | Admit: 2021-06-14 | Discharge: 2021-06-14 | Disposition: A | Discharge status: Home / Self Care | Payer: BC Managed Care – PPO | Source: Ambulatory Visit | Attending: Radiation Oncology | Admitting: Radiation Oncology

## 2021-06-14 DIAGNOSIS — C50411 Malignant neoplasm of upper-outer quadrant of right female breast: Secondary | ICD-10-CM | POA: Diagnosis not present

## 2021-06-14 DIAGNOSIS — Z170 Estrogen receptor positive status [ER+]: Secondary | ICD-10-CM | POA: Diagnosis not present

## 2021-06-14 DIAGNOSIS — Z510 Encounter for antineoplastic radiation therapy: Secondary | ICD-10-CM | POA: Diagnosis not present

## 2021-06-17 ENCOUNTER — Ambulatory Visit
Admission: RE | Admit: 2021-06-17 | Discharge: 2021-06-17 | Disposition: A | Discharge status: Home / Self Care | Payer: BC Managed Care – PPO | Source: Ambulatory Visit | Attending: Radiation Oncology | Admitting: Radiation Oncology

## 2021-06-17 ENCOUNTER — Other Ambulatory Visit: Payer: Self-pay

## 2021-06-17 DIAGNOSIS — C50411 Malignant neoplasm of upper-outer quadrant of right female breast: Secondary | ICD-10-CM | POA: Diagnosis not present

## 2021-06-17 DIAGNOSIS — Z510 Encounter for antineoplastic radiation therapy: Secondary | ICD-10-CM | POA: Diagnosis not present

## 2021-06-17 DIAGNOSIS — Z170 Estrogen receptor positive status [ER+]: Secondary | ICD-10-CM | POA: Diagnosis not present

## 2021-06-18 ENCOUNTER — Ambulatory Visit
Admission: RE | Admit: 2021-06-18 | Discharge: 2021-06-18 | Disposition: A | Discharge status: Home / Self Care | Payer: BC Managed Care – PPO | Source: Ambulatory Visit | Attending: Radiation Oncology | Admitting: Radiation Oncology

## 2021-06-18 DIAGNOSIS — C50411 Malignant neoplasm of upper-outer quadrant of right female breast: Secondary | ICD-10-CM | POA: Diagnosis not present

## 2021-06-18 DIAGNOSIS — Z510 Encounter for antineoplastic radiation therapy: Secondary | ICD-10-CM | POA: Diagnosis not present

## 2021-06-18 DIAGNOSIS — Z170 Estrogen receptor positive status [ER+]: Secondary | ICD-10-CM | POA: Diagnosis not present

## 2021-06-19 ENCOUNTER — Ambulatory Visit
Admission: RE | Admit: 2021-06-19 | Discharge: 2021-06-19 | Disposition: A | Discharge status: Home / Self Care | Payer: BC Managed Care – PPO | Source: Ambulatory Visit | Attending: Radiation Oncology | Admitting: Radiation Oncology

## 2021-06-19 ENCOUNTER — Other Ambulatory Visit: Payer: Self-pay

## 2021-06-19 DIAGNOSIS — Z170 Estrogen receptor positive status [ER+]: Secondary | ICD-10-CM | POA: Diagnosis not present

## 2021-06-19 DIAGNOSIS — Z510 Encounter for antineoplastic radiation therapy: Secondary | ICD-10-CM | POA: Diagnosis not present

## 2021-06-19 DIAGNOSIS — C50411 Malignant neoplasm of upper-outer quadrant of right female breast: Secondary | ICD-10-CM | POA: Diagnosis not present

## 2021-06-20 ENCOUNTER — Ambulatory Visit
Admission: RE | Admit: 2021-06-20 | Discharge: 2021-06-20 | Disposition: A | Discharge status: Home / Self Care | Payer: BC Managed Care – PPO | Source: Ambulatory Visit | Attending: Radiation Oncology | Admitting: Radiation Oncology

## 2021-06-20 DIAGNOSIS — Z510 Encounter for antineoplastic radiation therapy: Secondary | ICD-10-CM | POA: Diagnosis not present

## 2021-06-20 DIAGNOSIS — Z170 Estrogen receptor positive status [ER+]: Secondary | ICD-10-CM | POA: Diagnosis not present

## 2021-06-20 DIAGNOSIS — C50411 Malignant neoplasm of upper-outer quadrant of right female breast: Secondary | ICD-10-CM | POA: Diagnosis not present

## 2021-06-21 ENCOUNTER — Ambulatory Visit
Admission: RE | Admit: 2021-06-21 | Discharge: 2021-06-21 | Disposition: A | Discharge status: Home / Self Care | Payer: BC Managed Care – PPO | Source: Ambulatory Visit | Attending: Radiation Oncology | Admitting: Radiation Oncology

## 2021-06-21 ENCOUNTER — Other Ambulatory Visit: Payer: Self-pay

## 2021-06-21 DIAGNOSIS — Z510 Encounter for antineoplastic radiation therapy: Secondary | ICD-10-CM | POA: Diagnosis not present

## 2021-06-21 DIAGNOSIS — Z170 Estrogen receptor positive status [ER+]: Secondary | ICD-10-CM | POA: Diagnosis not present

## 2021-06-21 DIAGNOSIS — C50411 Malignant neoplasm of upper-outer quadrant of right female breast: Secondary | ICD-10-CM | POA: Diagnosis not present

## 2021-06-24 ENCOUNTER — Other Ambulatory Visit: Payer: Self-pay

## 2021-06-24 ENCOUNTER — Ambulatory Visit
Admission: RE | Admit: 2021-06-24 | Discharge: 2021-06-24 | Disposition: A | Discharge status: Home / Self Care | Payer: BC Managed Care – PPO | Source: Ambulatory Visit | Attending: Radiation Oncology | Admitting: Radiation Oncology

## 2021-06-24 DIAGNOSIS — C50411 Malignant neoplasm of upper-outer quadrant of right female breast: Secondary | ICD-10-CM | POA: Diagnosis not present

## 2021-06-24 DIAGNOSIS — Z510 Encounter for antineoplastic radiation therapy: Secondary | ICD-10-CM | POA: Diagnosis not present

## 2021-06-24 DIAGNOSIS — Z170 Estrogen receptor positive status [ER+]: Secondary | ICD-10-CM | POA: Diagnosis not present

## 2021-06-25 ENCOUNTER — Ambulatory Visit
Admission: RE | Admit: 2021-06-25 | Discharge: 2021-06-25 | Disposition: A | Discharge status: Home / Self Care | Payer: BC Managed Care – PPO | Source: Ambulatory Visit | Attending: Radiation Oncology | Admitting: Radiation Oncology

## 2021-06-25 ENCOUNTER — Encounter (HOSPITAL_COMMUNITY): Payer: Self-pay

## 2021-06-25 DIAGNOSIS — Z170 Estrogen receptor positive status [ER+]: Secondary | ICD-10-CM | POA: Diagnosis not present

## 2021-06-25 DIAGNOSIS — Z510 Encounter for antineoplastic radiation therapy: Secondary | ICD-10-CM | POA: Diagnosis not present

## 2021-06-25 DIAGNOSIS — C50411 Malignant neoplasm of upper-outer quadrant of right female breast: Secondary | ICD-10-CM | POA: Diagnosis not present

## 2021-06-26 ENCOUNTER — Ambulatory Visit
Admission: RE | Admit: 2021-06-26 | Discharge: 2021-06-26 | Disposition: A | Discharge status: Home / Self Care | Payer: BC Managed Care – PPO | Source: Ambulatory Visit | Attending: Radiation Oncology | Admitting: Radiation Oncology

## 2021-06-26 ENCOUNTER — Ambulatory Visit: Payer: BC Managed Care – PPO

## 2021-06-26 DIAGNOSIS — Z510 Encounter for antineoplastic radiation therapy: Secondary | ICD-10-CM | POA: Diagnosis not present

## 2021-06-26 DIAGNOSIS — Z170 Estrogen receptor positive status [ER+]: Secondary | ICD-10-CM | POA: Diagnosis not present

## 2021-06-26 DIAGNOSIS — C50411 Malignant neoplasm of upper-outer quadrant of right female breast: Secondary | ICD-10-CM | POA: Diagnosis not present

## 2021-06-27 ENCOUNTER — Ambulatory Visit: Payer: BC Managed Care – PPO

## 2021-06-27 ENCOUNTER — Ambulatory Visit
Admission: RE | Admit: 2021-06-27 | Discharge: 2021-06-27 | Disposition: A | Discharge status: Home / Self Care | Payer: BC Managed Care – PPO | Source: Ambulatory Visit | Attending: Radiation Oncology | Admitting: Radiation Oncology

## 2021-06-27 DIAGNOSIS — Z170 Estrogen receptor positive status [ER+]: Secondary | ICD-10-CM | POA: Diagnosis not present

## 2021-06-27 DIAGNOSIS — C50411 Malignant neoplasm of upper-outer quadrant of right female breast: Secondary | ICD-10-CM | POA: Diagnosis not present

## 2021-06-27 DIAGNOSIS — Z510 Encounter for antineoplastic radiation therapy: Secondary | ICD-10-CM | POA: Diagnosis not present

## 2021-06-28 ENCOUNTER — Other Ambulatory Visit: Payer: Self-pay

## 2021-06-28 ENCOUNTER — Ambulatory Visit
Admission: RE | Admit: 2021-06-28 | Discharge: 2021-06-28 | Disposition: A | Discharge status: Home / Self Care | Payer: BC Managed Care – PPO | Source: Ambulatory Visit | Attending: Radiation Oncology | Admitting: Radiation Oncology

## 2021-06-28 ENCOUNTER — Ambulatory Visit: Payer: BC Managed Care – PPO

## 2021-06-28 DIAGNOSIS — Z510 Encounter for antineoplastic radiation therapy: Secondary | ICD-10-CM | POA: Diagnosis not present

## 2021-06-28 DIAGNOSIS — C50411 Malignant neoplasm of upper-outer quadrant of right female breast: Secondary | ICD-10-CM | POA: Diagnosis not present

## 2021-06-28 DIAGNOSIS — Z170 Estrogen receptor positive status [ER+]: Secondary | ICD-10-CM | POA: Diagnosis not present

## 2021-07-01 ENCOUNTER — Other Ambulatory Visit: Payer: Self-pay

## 2021-07-01 ENCOUNTER — Ambulatory Visit
Admission: RE | Admit: 2021-07-01 | Discharge: 2021-07-01 | Disposition: A | Discharge status: Home / Self Care | Payer: BC Managed Care – PPO | Source: Ambulatory Visit | Attending: Radiation Oncology | Admitting: Radiation Oncology

## 2021-07-01 ENCOUNTER — Ambulatory Visit: Payer: BC Managed Care – PPO

## 2021-07-01 ENCOUNTER — Encounter: Payer: Self-pay | Admitting: Radiation Oncology

## 2021-07-01 DIAGNOSIS — Z170 Estrogen receptor positive status [ER+]: Secondary | ICD-10-CM | POA: Diagnosis not present

## 2021-07-01 DIAGNOSIS — C50411 Malignant neoplasm of upper-outer quadrant of right female breast: Secondary | ICD-10-CM | POA: Diagnosis not present

## 2021-07-01 DIAGNOSIS — Z510 Encounter for antineoplastic radiation therapy: Secondary | ICD-10-CM | POA: Diagnosis not present

## 2021-07-02 ENCOUNTER — Ambulatory Visit: Payer: BC Managed Care – PPO | Admitting: Radiation Oncology

## 2021-07-02 ENCOUNTER — Ambulatory Visit: Payer: BC Managed Care – PPO

## 2021-07-03 ENCOUNTER — Ambulatory Visit: Payer: BC Managed Care – PPO

## 2021-07-04 ENCOUNTER — Ambulatory Visit: Payer: BC Managed Care – PPO

## 2021-07-05 ENCOUNTER — Ambulatory Visit: Payer: BC Managed Care – PPO

## 2021-07-08 ENCOUNTER — Other Ambulatory Visit: Payer: Self-pay

## 2021-07-08 ENCOUNTER — Ambulatory Visit: Payer: BC Managed Care – PPO

## 2021-07-08 ENCOUNTER — Encounter: Payer: Self-pay | Admitting: *Deleted

## 2021-07-08 ENCOUNTER — Ambulatory Visit: Payer: BC Managed Care – PPO | Attending: General Surgery | Admitting: Rehabilitation

## 2021-07-08 ENCOUNTER — Encounter: Payer: Self-pay | Admitting: Hematology

## 2021-07-08 ENCOUNTER — Inpatient Hospital Stay: Payer: BC Managed Care – PPO | Attending: Hematology | Admitting: Hematology

## 2021-07-08 VITALS — BP 142/97 | HR 91 | Temp 98.1°F | Resp 18 | Ht 62.0 in | Wt 186.5 lb

## 2021-07-08 DIAGNOSIS — Z170 Estrogen receptor positive status [ER+]: Secondary | ICD-10-CM | POA: Diagnosis not present

## 2021-07-08 DIAGNOSIS — Z483 Aftercare following surgery for neoplasm: Secondary | ICD-10-CM

## 2021-07-08 DIAGNOSIS — C50411 Malignant neoplasm of upper-outer quadrant of right female breast: Secondary | ICD-10-CM

## 2021-07-08 MED ORDER — EXEMESTANE 25 MG PO TABS: 25.0000 mg | ORAL_TABLET | Freq: Every day | ORAL | 3 refills | Status: DC

## 2021-07-08 NOTE — Therapy (Signed)
?OUTPATIENT PHYSICAL THERAPY [facility] SCREENING NOTE ? ? ?Patient Name: Donna Mcconnell ?MRN: [id number] ?DOB:[birth year], 61 y.o., female ?Today's Date: 07/08/2021 ? ?PCP: Shon Baton, MD ?REFERRING PROVIDER: Rolm Bookbinder, MD ? ? PT End of Session - 07/08/21 1412   ? ? Visit Number 2   screen only  ? PT Start Time 1414   ? PT Stop Time 1420   ? PT Time Calculation (min) 6 min   ? Activity Tolerance Patient tolerated treatment well   ? Behavior During Therapy [facility] for tasks assessed/performed   ? ?  ?  ? ?  ? ? ?Past Medical History:  ?Diagnosis Date  ? Allergy   ? shellfish  ? Ameloblastoma of jaw   ? s/p left jaw tumor resection April 2022 with bone graft July 2022 (Dr. Raj Janus)  ? Breast cancer ([place])   ? Complication of anesthesia   ? Coronary atherosclerosis   ? COVID 03/2019  ? fever, bodyaches  ? Fatty liver disease, nonalcoholic   ? Food allergy   ? GERD (gastroesophageal reflux disease)   ? Hyperlipidemia   ? Hypertension   ? PONV (postoperative nausea and vomiting)   ? Spondylosis   ? ?Past Surgical History:  ?Procedure Laterality Date  ? ADENOIDECTOMY    ? BREAST LUMPECTOMY WITH RADIOACTIVE SEED AND SENTINEL LYMPH NODE BIOPSY Right 04/04/2021  ? Procedure: RIGHT BREAST LUMPECTOMY WITH RADIOACTIVE SEED AND AXILLARY SENTINEL LYMPH NODE BIOPSY;  Surgeon: Rolm Bookbinder, MD;  Location: [place];  Service: General;  Laterality: Right;  ? BREAST RECONSTRUCTION Right 04/15/2021  ? Procedure: RIGHT ONCOPLASTIC BREAST RECONSTRUCTION;  Surgeon: Irene Limbo, MD;  Location: [place];  Service: Plastics;  Laterality: Right;  ? BREAST REDUCTION SURGERY Left 04/15/2021  ? Procedure: LEFT BREAST REDUCTION  (BREAST);  Surgeon: Irene Limbo, MD;  Location: [place];  Service: Plastics;  Laterality: Left;  ? cercalage    ? COLONOSCOPY    ? 10 yrs ago  ? MOUTH SURGERY  03/28/2021  ? TONSILLECTOMY    ? TYMPANOSTOMY TUBE PLACEMENT    ? ?Patient Active Problem List  ? Diagnosis Date Noted  ? Genetic testing 03/05/2021  ?  Family history of breast cancer 02/20/2021  ? Family history of prostate cancer 02/20/2021  ? Malignant neoplasm of upper-outer quadrant of right breast in female, estrogen receptor positive ([place]) 02/15/2021  ? ? ?REFERRING DIAG: Rt breast cancer ? ?THERAPY DIAG:  ?Malignant neoplasm of upper-outer quadrant of right breast in female, estrogen receptor positive ([place]) ? ?Aftercare following surgery for neoplasm ? ?PERTINENT HISTORY: Patient was diagnosed on 01/14/2021 with right grade I-II invasive ductal carcinoma breast cancer. It measures 8 mm and has an area od calicifications measuring 1.8 cm in the upper outer quadrant.. It is ER/PR positive and HER2 negative with a Ki67 of 1%. She has a hx of a mandibular surgery due to a benign tumor in 07/2020 and had to have a bone graft in 10/2020. She is missing multiple teeth on the bottom left side. ? ?PRECAUTIONS: lymphedema risk Rt UE ? ?SUBJECTIVE: no signs of swelling ? ?PAIN:  ?Are you having pain? No ? ?SOZO SCREENING: ?Patient was assessed today using the SOZO machine to determine the lymphedema index score. This was compared to her baseline score. It was determined that she is within the recommended range when compared to her baseline and no further action is needed at this time. She will return in 3 months for her next SOZO screen. ? ? ? ?[place],  Adrian Prince, PT ?07/08/2021, 2:24 PM ? ?  ? ?

## 2021-07-08 NOTE — Progress Notes (Signed)
[place]   [phone] Fax:([phone]   Clinic Follow up Note   Patient Care Team: Shon Baton, MD as PCP - General (Internal Medicine) Rockwell Germany, RN as Oncology Nurse Navigator Mauro Kaufmann, RN as Oncology Nurse Navigator Rolm Bookbinder, MD as Consulting Physician (General Surgery) Truitt Merle, MD as Consulting Physician (Hematology) Gery Pray, MD as Consulting Physician (Radiation Oncology)  Date of Service:  07/08/2021  CHIEF COMPLAINT: f/u of right breast cancer  CURRENT THERAPY:  To start exemestane  ASSESSMENT & PLAN:  Donna Mcconnell is a 61 y.o. postmenopausal female with   1. Malignant neoplasm of upper-outer quadrant of right breast, IDC and DCIS, Stage IA, p(T1b, N0), ER+/PR+/HER2-, Grade 2  -found on screening mammogram. Biopsy 02/08/21 confirmed invasive ductal carcinoma with DCIS.  -genetic testing was negative. -right lumpectomy on 04/04/21 with Dr. Donne Hazel showed: 1.1 cm IDC and DCIS, both grade 1; [place]. Margins and lymph nodes negative. -Oncotype RS of 14, low risk. -she received radiation therapy under Dr. Sondra Come 1/26-07/01/21. -Giving the strong ER and PR expression in her postmenopausal status, I recommend adjuvant endocrine therapy with aromatase inhibitor for a total of 5-10 years to reduce the risk of cancer recurrence. The potential benefit and side effects, which includes but not limited to, hot flash, skin and vaginal dryness, metabolic changes ( increased blood glucose, cholesterol, weight, etc.), slightly in increased risk of cardiovascular disease, cataracts, muscular and joint discomfort, osteopenia and osteoporosis, etc, were discussed with her in great details. She is interested. Given her baseline joint pain, I recommend exemestane. -We also discussed the breast cancer surveillance after her surgery. She will continue annual screening mammogram, self exam, and a routine office visit with lab and exam with  [place]. -I encouraged her to have healthy diet and exercise regularly.    2. Bone Health  -DEXA on 03/14/21 was normal, with lowest T-score of -0.7 at left radius.   3. Hot Flashes, Anxiety -was on HRT for many years, stopped with cancer diagnosis -has trouble sleeping and anxiety with new diagnosis. I previously prescribed Effexor, but she never took it. We reviewed again today.  She knows to use if she has worsening hot flashes.     PLAN:  -start exemestane in first week of April -survivorship in 3 months -lab and f/u in 6 months   No problem-specific Assessment & Plan notes found for this encounter.   SUMMARY OF ONCOLOGIC HISTORY: Oncology History Overview Note   Cancer Staging  Malignant neoplasm of upper-outer quadrant of right breast in female, estrogen receptor positive ([place]) Staging form: Breast, AJCC 8th Edition - Clinical stage from 02/08/2021: Stage IA (cT1b, cN0, cM0, G2, ER+, PR+, HER2-) - Signed by Truitt Merle, MD on 02/19/2021 - Pathologic stage from 04/04/2021: Stage IA (pT1c, pN0, cM0, G1, ER+, PR+, HER2-, Oncotype DX score: 14) - Signed by Truitt Merle, MD on 07/08/2021     Malignant neoplasm of upper-outer quadrant of right breast in female, estrogen receptor positive ([place])  02/02/2021 Mammogram   Right Diagnostic MM and Right Breast US  IMPRESSION: 1.  Suspicious 0.8 cm mass in the right breast at the 12 position. 2. Suspicious calcifications adjacent to the right breast mass spanning 1.1 cm.  No lymphadenopathy seen in right axilla.   02/08/2021 Cancer Staging   Staging form: Breast, AJCC 8th Edition - Clinical stage from 02/08/2021: Stage IA (cT1b, cN0, cM0, G2, ER+, PR+, HER2-) - Signed by Truitt Merle, MD on 02/19/2021 Stage  prefix: Initial diagnosis Histologic grading system: 3 grade system    02/08/2021 Pathology Results   Diagnosis 1. Breast, right, needle core biopsy, 12 o'clock, ribbon shaped clip - INVASIVE DUCTAL CARCINOMA - DUCTAL CARCINOMA IN  SITU - CALCIFICATIONS - SEE COMMENT 2. Breast, right, needle core biopsy, upper inner quadrant, x shaped clip - FLAT EPITHELIAL ATYPIA WITH CALCIFICATIONS - LOBULAR NEOPLASIA (ATYPICAL LOBULAR HYPERPLASIA) Microscopic Comment 1. Based on the biopsy, the carcinoma appears Nottingham grade 1-2 of 3 and measures 0.6 cm in greatest linear extent.  1. PROGNOSTIC INDICATORS Results: The tumor cells are EQUIVOCAL for Her2 (2+). Her2 by FISH will be performed and results reported separately. Estrogen Receptor: >95%, POSITIVE, STRONG STAINING INTENSITY Progesterone Receptor: >95%, POSITIVE, STRONG STAINING INTENSITY Proliferation Marker Ki67: 1%  1. FLUORESCENCE IN-SITU HYBRIDIZATION Results: GROUP 5: HER2 **NEGATIVE**   02/15/2021 Initial Diagnosis   Malignant neoplasm of upper-outer quadrant of right breast in female, estrogen receptor positive ([place])   03/04/2021 Genetic Testing   Negative hereditary cancer genetic testing: no pathogenic variants detected in [facility] CustomNext-Cancer +RNAinsight Panel.  The report date is March 04, 2021.   The CustomNext-Cancer+RNAinsight panel offered by [facility] includes sequencing and rearrangement analysis for the following 47 genes:  APC, ATM, AXIN2, BARD1, BMPR1A, BRCA1, BRCA2, BRIP1, CDH1, CDK4, CDKN2A, CHEK2, DICER1, EPCAM, GREM1, HOXB13, MEN1, MLH1, MSH2, MSH3, MSH6, MUTYH, NBN, NF1, NF2, NTHL1, PALB2, PMS2, POLD1, POLE, PTEN, RAD51C, RAD51D, RECQL, RET, SDHA, SDHAF2, SDHB, SDHC, SDHD, SMAD4, SMARCA4, STK11, TP53, TSC1, TSC2, and VHL.  RNA data is routinely analyzed for use in variant interpretation for all genes.   04/04/2021 Cancer Staging   Staging form: Breast, AJCC 8th Edition - Pathologic stage from 04/04/2021: Stage IA (pT1c, pN0, cM0, G1, ER+, PR+, HER2-, Oncotype DX score: 14) - Signed by Truitt Merle, MD on 07/08/2021 Stage prefix: Initial diagnosis Multigene prognostic tests performed: Oncotype DX Recurrence score range: Greater than or  equal to 11 Histologic grading system: 3 grade system    04/04/2021 Definitive Surgery   FINAL MICROSCOPIC DIAGNOSIS:   A. BREAST, RIGHT, LUMPECTOMY:  - Invasive ductal carcinoma, 1.1 cm, grade 1  - Ductal carcinoma in situ, low-grade  - Resection margins are negative for carcinoma - closest is the anterior  margin at 0.8 cm  - Atypical lobular hyperplasia  - Biopsy site changes  - See oncology table   B. LYMPH NODE, RIGHT AXILLA, SENTINEL, EXCISION:  - Lymph node, negative for carcinoma (0/1)   C. LYMPH NODE, RIGHT AXILLA, SENTINEL, EXCISION:  - Lymph node, negative for carcinoma (0/1)   D. LYMPH NODE, RIGHT AXILLA, SENTINEL, EXCISION:  - Lymph node, negative for carcinoma (0/1)   E. BREAST, RIGHT POSTERIOR MARGIN, EXCISION:  - Atypical lobular hyperplasia    04/04/2021 Oncotype testing   Oncotype DX was obtained on the final surgical sample and the recurrence score of 14 predicts a risk of recurrence outside the breast over the next 9 years of 4%, if the patient's only systemic therapy is an antiestrogen for 5 years.  It also predicts no benefit from chemotherapy.    04/15/2021 Pathology Results   FINAL MICROSCOPIC DIAGNOSIS:   A. BREAST, RIGHT, MAMMOPLASTY:  - Benign mammary parenchyma with fat necrosis and giant cell reaction, 121 grams.  - Negative for atypical proliferative breast disease.   B. BREAST, LEFT, MAMMOPLASTY:  - Benign mammary parenchyma, 175 grams.  - Negative for atypical proliferative breast disease.      INTERVAL HISTORY:  Donna Mcconnell is here  for a follow up of breast cancer. She was last seen by me on 02/20/21. She presents to the clinic accompanied by her husband. She reports she has tolerated radiation well. She denies fatigue and notes only mild skin changes. She notes a history of joint pain, no official diagnosis of arthritis. She endorses using Advil for relief.   All other systems were reviewed with the patient and are  negative.  MEDICAL HISTORY:  Past Medical History:  Diagnosis Date   Allergy    shellfish   Ameloblastoma of jaw    s/p left jaw tumor resection April 2022 with bone graft July 2022 (Dr. Raj Janus)   Breast cancer [facility])    Complication of anesthesia    Coronary atherosclerosis    COVID 03/2019   fever, bodyaches   Fatty liver disease, nonalcoholic    Food allergy    GERD (gastroesophageal reflux disease)    Hyperlipidemia    Hypertension    PONV (postoperative nausea and vomiting)    Spondylosis     SURGICAL HISTORY: Past Surgical History:  Procedure Laterality Date   ADENOIDECTOMY     BREAST LUMPECTOMY WITH RADIOACTIVE SEED AND SENTINEL LYMPH NODE BIOPSY Right 04/04/2021   Procedure: RIGHT BREAST LUMPECTOMY WITH RADIOACTIVE SEED AND AXILLARY SENTINEL LYMPH NODE BIOPSY;  Surgeon: Rolm Bookbinder, MD;  Location: [place];  Service: General;  Laterality: Right;   BREAST RECONSTRUCTION Right 04/15/2021   Procedure: RIGHT ONCOPLASTIC BREAST RECONSTRUCTION;  Surgeon: Irene Limbo, MD;  Location: [place];  Service: Plastics;  Laterality: Right;   BREAST REDUCTION SURGERY Left 04/15/2021   Procedure: LEFT BREAST REDUCTION  (BREAST);  Surgeon: Irene Limbo, MD;  Location: [place];  Service: Plastics;  Laterality: Left;   cercalage     COLONOSCOPY     10 yrs ago   MOUTH SURGERY  03/28/2021   TONSILLECTOMY     TYMPANOSTOMY TUBE PLACEMENT      I have reviewed the social history and family history with the patient and they are unchanged from previous note.  ALLERGIES:  is allergic to shellfish allergy.  MEDICATIONS:  Current Outpatient Medications  Medication Sig Dispense Refill   exemestane (AROMASIN) 25 MG tablet Take 1 tablet (25 mg total) by mouth daily after breakfast. 30 tablet 3   amoxicillin-clavulanate (AUGMENTIN) 875-125 MG tablet Take 1 tablet by mouth 2 (two) times daily.     ASTAXANTHIN PO Take 16 mg by mouth daily.     CALCIUM-VITAMIN D PO Take 1 tablet  by mouth daily.     chlorhexidine (PERIDEX) 0.12 % solution 15 mLs by Mouth Rinse route 2 (two) times daily.     ibuprofen (ADVIL) 200 MG tablet Take 400 mg by mouth every 6 (six) hours as needed.     pantoprazole (PROTONIX) 40 MG tablet Take 40 mg by mouth daily.     valsartan (DIOVAN) 80 MG tablet Take 80 mg by mouth daily.     No current facility-administered medications for this visit.    PHYSICAL EXAMINATION: ECOG PERFORMANCE STATUS: 0 - Asymptomatic  Vitals:   07/08/21 1129  BP: (!) 142/97  Pulse: 91  Resp: 18  Temp: 98.1 F (36.7 C)  SpO2: 100%   Wt Readings from Last 3 Encounters:  07/08/21 186 lb 8 oz (84.6 kg)  05/15/21 188 lb 8 oz (85.5 kg)  04/15/21 183 lb (83 kg)     GENERAL:alert, no distress and comfortable SKIN: skin color, texture, turgor are normal, no rashes or significant lesions EYES:  normal, Conjunctiva are pink and non-injected, sclera clear  NEURO: alert & oriented x 3 with fluent speech, no focal motor/sensory deficits BREAST: No palpable mass, nodules or adenopathy bilaterally. Breast exam benign.   LABORATORY DATA:  I have reviewed the data as listed CBC Latest Ref Rng & Units 03/28/2021 02/20/2021  WBC 4.0 - 10.5 K/uL 5.6 4.0  Hemoglobin 12.0 - 15.0 g/dL 13.9 13.7  Hematocrit 36.0 - 46.0 % 44.2 41.0  Platelets 150 - 400 K/uL 310 289     CMP Latest Ref Rng & Units 03/28/2021 02/20/2021  Glucose 70 - 99 mg/dL 114(H) 126(H)  BUN 8 - 23 mg/dL 14 11  Creatinine 0.44 - 1.00 mg/dL 0.77 0.80  Sodium 135 - 145 mmol/L 139 138  Potassium 3.5 - 5.1 mmol/L 3.9 3.9  Chloride 98 - 111 mmol/L 103 106  CO2 22 - 32 mmol/L 27 22  Calcium 8.9 - 10.3 mg/dL 9.4 9.7  Total Protein 6.5 - 8.1 g/dL 7.2 7.3  Total Bilirubin 0.3 - 1.2 mg/dL 0.7 1.0  Alkaline Phos 38 - 126 U/L 75 78  AST 15 - 41 U/L 38 33  ALT 0 - 44 U/L 21 21      RADIOGRAPHIC STUDIES: I have personally reviewed the radiological images as listed and agreed with the findings in the  report. No results found.    No orders of the defined types were placed in this encounter.  All questions were answered. The patient knows to call the clinic with any problems, questions or concerns. No barriers to learning was detected. The total time spent in the appointment was 30 minutes.     Truitt Merle, MD 07/08/2021   I, Wilburn Mylar, am acting as scribe for Truitt Merle, MD.   I have reviewed the above documentation for accuracy and completeness, and I agree with the above.

## 2021-07-09 ENCOUNTER — Ambulatory Visit: Payer: BC Managed Care – PPO

## 2021-07-10 ENCOUNTER — Ambulatory Visit: Payer: BC Managed Care – PPO | Admitting: Radiation Oncology

## 2021-07-11 ENCOUNTER — Ambulatory Visit: Payer: BC Managed Care – PPO

## 2021-07-12 ENCOUNTER — Ambulatory Visit: Payer: BC Managed Care – PPO

## 2021-07-15 ENCOUNTER — Ambulatory Visit: Payer: BC Managed Care – PPO

## 2021-07-16 ENCOUNTER — Ambulatory Visit: Payer: BC Managed Care – PPO

## 2021-07-23 DIAGNOSIS — M272 Inflammatory conditions of jaws: Secondary | ICD-10-CM | POA: Diagnosis not present

## 2021-07-23 DIAGNOSIS — T8131XD Disruption of external operation (surgical) wound, not elsewhere classified, subsequent encounter: Secondary | ICD-10-CM | POA: Diagnosis not present

## 2021-07-23 DIAGNOSIS — D165 Benign neoplasm of lower jaw bone: Secondary | ICD-10-CM | POA: Diagnosis not present

## 2021-07-26 ENCOUNTER — Ambulatory Visit (INDEPENDENT_AMBULATORY_CARE_PROVIDER_SITE_OTHER): Payer: BC Managed Care – PPO | Admitting: Allergy

## 2021-07-26 ENCOUNTER — Encounter: Payer: Self-pay | Admitting: Allergy

## 2021-07-26 VITALS — BP 110/64 | HR 96 | Temp 98.1°F | Resp 18 | Ht 61.0 in | Wt 182.8 lb

## 2021-07-26 DIAGNOSIS — T7800XA Anaphylactic reaction due to unspecified food, initial encounter: Secondary | ICD-10-CM

## 2021-07-26 DIAGNOSIS — J310 Chronic rhinitis: Secondary | ICD-10-CM | POA: Diagnosis not present

## 2021-07-26 MED ORDER — EPINEPHRINE 0.3 MG/0.3ML IJ SOAJ: 0.3000 mg | INTRAMUSCULAR | 1 refills | Status: DC | PRN

## 2021-07-26 NOTE — Patient Instructions (Addendum)
Anaphylaxis due to food ? - continue avoidance of all shellfish  ? - have access to self-injectable epinephrine (Epipen or AuviQ) 0.4m at all times ? - follow emergency action plan in case of allergic reaction ? ?Seasonal Allergies ?- recommend taking Xyzal 1 tab daily as needed ? ?Follow-up: 1 year or sooner if needed ? ?

## 2021-07-26 NOTE — Progress Notes (Signed)
? ? ?Follow-up Note ? ?RE: Donna Mcconnell MRN: [id number] DOB: 06/04/1960 ?Date of Office Visit: 07/26/2021 ? ? ?History of present illness: ?Donna Mcconnell is a 61 y.o. female presenting today for follow-up of food allergy.  She was last seen in the office on 07/26/20 by myself.  We did perform a food challenge to shrimp as she had favorable testing but developed facial hives during the challenge and thus was not passed.  Advised to continue avoidance which she has been doing over the past year.  She has access to her epinephrine device which she will refill today.   ? ?The past year however has been quite eventful.  She was diagnosed with breast cancer that was called for leg pain and low-grade however it is hormone positive and she will be starting hormone blockers next week.  She did have sting many rounds of radiation that she finished in March and she also had a lumpectomy. ?She also had resurgence of mandibular ameloblastoma that was benign but due to recurrence it was resected and she is awaiting for bone graft.   ?She has lost weight over the past year and has been more active with walking and Pilates.  ? ?She did go to [place] over the weekend and states since this trip she has had increase in cough, sneezing and nasal congestion and drainage.  She has been taking DayQuil and NyQuil.  She traditionally has not had any symptoms related to seasonal allergies. ? ?Review of systems: ?Review of Systems  ?Constitutional: Negative.   ?HENT:  Positive for congestion, rhinorrhea and sneezing.   ?Eyes: Negative.   ?Respiratory:  Positive for cough.   ?Cardiovascular: Negative.   ?Gastrointestinal: Negative.   ?Musculoskeletal: Negative.   ?Skin: Negative.   ?Allergic/Immunologic: Negative.   ?Neurological: Negative.    ? ?All other systems negative unless noted above in HPI ? ?Past medical/social/surgical/family history have been reviewed and are unchanged unless specifically indicated below. ? ?No  changes ? ?Medication List: ?Current Outpatient Medications  ?Medication Sig Dispense Refill  ? ASTAXANTHIN PO Take 16 mg by mouth daily.    ? CALCIUM-VITAMIN D PO Take 1 tablet by mouth daily.    ? chlorhexidine (PERIDEX) 0.12 % solution 15 mLs by Mouth Rinse route 2 (two) times daily.    ? EPINEPHrine (AUVI-Q) 0.3 mg/0.3 mL IJ SOAJ injection Inject 0.3 mg into the muscle as needed for anaphylaxis. 2 each 1  ? exemestane (AROMASIN) 25 MG tablet Take 1 tablet (25 mg total) by mouth daily after breakfast. 30 tablet 3  ? pantoprazole (PROTONIX) 40 MG tablet Take 40 mg by mouth daily.    ? valsartan (DIOVAN) 80 MG tablet Take 80 mg by mouth daily.    ? ?No current facility-administered medications for this visit.  ?  ? ?Known medication allergies: ?Allergies  ?Allergen Reactions  ? Shellfish Allergy   ?  Mussels - Nausea and Vomiting ?Other Shellfish - welts   ? ? ? ?Physical examination: ?Blood pressure 110/64, pulse 96, temperature 98.1 ?F (36.7 ?C), resp. rate 18, height 5' 1"  (1.549 m), weight 182 lb 12.8 oz (82.9 kg), SpO2 99 %. ? ?General: Alert, interactive, in no acute distress. ?HEENT: PERRLA, TMs pearly gray, turbinates mildly edematous without discharge, post-pharynx non erythematous, edentulous of the lower left side of the mouth. ?Neck: Supple without lymphadenopathy. ?Lungs: Clear to auscultation without wheezing, rhonchi or rales. {no increased work of breathing. ?CV: Normal S1, S2 without murmurs. ?Abdomen: Nondistended, nontender. ?Skin: Warm  and dry, without lesions or rashes. ?Extremities:  No clubbing, cyanosis or edema. ?Neuro:   Grossly intact. ? ?Diagnositics/Labs: ?None today ? ? ?Assessment and plan: ?  ?Anaphylaxis due to food ? - continue avoidance of all shellfish  ? - have access to self-injectable epinephrine (Epipen or AuviQ) 0.45m at all times ? - follow emergency action plan in case of allergic reaction ? - discussed today after some more time has elapsed we can revisit the challenge to  shrimp in the future ? ?Rhinitis ?- recommend taking Xyzal 1 tab daily as needed ? ?Follow-up: 1 year or sooner if needed ? ?I [facility]. Please do not hesitate to contact me with questions. ? ?Sincerely, ? ? ?SPrudy Feeler MD ?Allergy/Immunology ?[facility] ? ? ?

## 2021-07-29 ENCOUNTER — Telehealth: Payer: Self-pay | Admitting: *Deleted

## 2021-07-29 ENCOUNTER — Telehealth: Payer: Self-pay | Admitting: Adult Health

## 2021-07-29 ENCOUNTER — Other Ambulatory Visit: Payer: Self-pay | Admitting: *Deleted

## 2021-07-29 NOTE — Telephone Encounter (Signed)
PA has been submitted through CoverMyMeds for Auvi Q and is currently pending approval/denial.  ?

## 2021-07-29 NOTE — Telephone Encounter (Signed)
Rescheduled appointment per provider template. Left message. ?

## 2021-07-30 NOTE — Telephone Encounter (Signed)
PA was denied stating that it needed to be generic EpiPen. Called and spoke with patient and she stated that the pharmacy got it to work out, I advised that if there are any issues with picking up the medication to let me know and I can send in generic EpiPen. Patient verbalized understanding.  ?

## 2021-07-31 NOTE — Progress Notes (Signed)
?Radiation Oncology         [phone] ?________________________________ ? ?Name: Donna Mcconnell MRN: [id number]  ?Date: 08/01/2021  DOB: 1961/04/01 ? ?Follow-Up Visit Note ? ?CC: Shon Baton, MD  Shon Baton, MD ? ?  ICD-10-CM   ?1. Malignant neoplasm of upper-outer quadrant of right breast in female, estrogen receptor positive ([place])  C50.411   ? Z17.0   ?  ? ? ?Diagnosis:  Stage IA (cT1b, cN0, cM0) Right Breast UOQ, Invasive ductal carcinoma and low-grade DCIS, ER+ / PR+ / Her2-, Grade 1 ? ?Interval Since Last Radiation:  1 month ? ?Intent: Curative ? ?Radiation Treatment Dates: 05/23/2021 through 07/01/2021 ?Site Technique Total Dose (Gy) Dose per Fx (Gy) Completed Fx Beam Energies  ?Breast, Right: Breast_R 3D 50.4/50.4 1.8 28/28 6XFFF, 10XFFF  ? ? ?Narrative:  The patient returns today for routine follow-up.  The patient tolerated radiation therapy relatively well. On the date of her final treatment, the patient reported redness and tenderness to the right breast. Physical exam performed showed diffuse erythema throughout the right breast without any skin breakdown. Otherwise, the patient tolerated radiation treatment quite well and denied any other complaints.     ? ?Since completing XRT, the patient followed up with Dr. Burr Medico on 07/08/21. During which time, the patient denied any fatigue and noted only mild skin changes from radiation. Further treatment options were discussed with the patient, and given the strong ER and PR expression in her postmenopausal status, Dr. Burr Medico recommended adjuvant endocrine therapy with AI for a total of 5-10 years to reduce the risk of cancer recurrence.            ? ?Otherwise, no significant interval history since the patient was last seen.  ? ?She reports ports as skin is healed well.  She has some tenderness in the breast when sleeping on her stomach but otherwise denies any breast discomfort or pain.  She denies any problems with swelling in her right arm or hand.                 ? ?Allergies:  is allergic to shellfish allergy. ? ?Meds: ?Current Outpatient Medications  ?Medication Sig Dispense Refill  ? ASTAXANTHIN PO Take 16 mg by mouth daily.    ? CALCIUM-VITAMIN D PO Take 1 tablet by mouth daily.    ? chlorhexidine (PERIDEX) 0.12 % solution 15 mLs by Mouth Rinse route 2 (two) times daily.    ? EPINEPHrine (AUVI-Q) 0.3 mg/0.3 mL IJ SOAJ injection Inject 0.3 mg into the muscle as needed for anaphylaxis. 2 each 1  ? exemestane (AROMASIN) 25 MG tablet Take 1 tablet (25 mg total) by mouth daily after breakfast. 30 tablet 3  ? pantoprazole (PROTONIX) 40 MG tablet Take 40 mg by mouth daily.    ? valsartan (DIOVAN) 80 MG tablet Take 80 mg by mouth daily.    ? ?No current facility-administered medications for this encounter.  ? ? ?Physical Findings: ?The patient is in no acute distress. Patient is alert and oriented. ? height is 5' 2"  (1.575 m) and weight is 187 lb 3.2 oz (84.9 kg). Her temperature is 97.5 ?F (36.4 ?C) (abnormal). Her blood pressure is 122/81 and her pulse is 86. Her respiration is 20 and oxygen saturation is 100%. Marland Kitchen lungs are clear to auscultation bilaterally. Heart has regular rate and rhythm. No palpable cervical, supraclavicular, or axillary adenopathy. Abdomen soft, non-tender, normal bowel sounds. ? ?Right Breast: no palpable mass, nipple discharge or bleeding.  Minimal  edema and hyperpigmentation changes.  Reduction scars well-healed without any breakdown.  Left breast shows reduction scars without any signs of infection. ? ? ? ?Lab Findings: ?Lab Results  ?Component Value Date  ? WBC 5.6 03/28/2021  ? HGB 13.9 03/28/2021  ? HCT 44.2 03/28/2021  ? MCV 96.1 03/28/2021  ? PLT 310 03/28/2021  ? ? ?Radiographic Findings: ?No results found. ? ?Impression:  Stage IA (cT1b, cN0, cM0) Right Breast UOQ, Invasive ductal carcinoma and low-grade DCIS, ER+ / PR+ / Her2-, Grade 1 ? ?The patient has recovered well from her radiation therapy.  No evidence of recurrence on clinical exam  today. ? ?Plan: As needed follow-up in radiation oncology.  Patient will continue close follow-up in medical oncology and surgery. ? ?________________________ ? ?Blair Promise, PhD, MD ? ? ?This document serves as a record of services personally performed by Gery Pray, MD. It was created on his behalf by Roney Mans, a trained medical scribe. The creation of this record is based on the scribe's personal observations and the provider's statements to them. This document has been checked and approved by the attending provider. ? ?

## 2021-07-31 NOTE — Progress Notes (Incomplete)
?  Radiation Oncology         [phone] ?________________________________ ? ?Patient Name: Donna Mcconnell ?MRN: [id number] ?DOB: 1960/06/01 ?Referring Physician: Shon Baton (Profile Not Attached) ?Date of Service: 07/01/2021 ?[facility] [place] ? ?                                                      End Of Treatment Note ? ?Diagnoses: C50.411-Malignant neoplasm of upper-outer quadrant of right female breast ?Z17.0-Estrogen receptor positive status [ER+] ? ?Cancer Staging: Stage IA (cT1b, cN0, cM0) Right Breast UOQ, Invasive ductal carcinoma and low-grade DCIS, ER+ / PR+ / Her2-, Grade 1 ? ?Intent: Curative ? ?Radiation Treatment Dates: 05/23/2021 through 07/01/2021 ?Site Technique Total Dose (Gy) Dose per Fx (Gy) Completed Fx Beam Energies  ?Breast, Right: Breast_R 3D 50.4/50.4 1.8 28/28 6XFFF, 10XFFF  ? ?Narrative: The patient tolerated radiation therapy relatively well. On the date of her final treatment, the patient reported redness and tenderness to the right breast. Physical exam performed showed diffuse erythema throughout without any skin breakdown to the right breast area. Otherwise, the patient tolerated radiation treatment quite well and denied any other complaints.  ? ?Plan: The patient will follow-up with radiation oncology in one month . ? ?________________________________________________ ?----------------------------------- ? ?Blair Promise, PhD, MD ? ?This document serves as a record of services personally performed by Gery Pray, MD. It was created on his behalf by Roney Mans, a trained medical scribe. The creation of this record is based on the scribe's personal observations and the provider's statements to them. This document has been checked and approved by the attending provider. ? ?

## 2021-08-01 ENCOUNTER — Other Ambulatory Visit: Payer: Self-pay

## 2021-08-01 ENCOUNTER — Encounter: Payer: Self-pay | Admitting: Radiation Oncology

## 2021-08-01 ENCOUNTER — Ambulatory Visit
Admission: RE | Admit: 2021-08-01 | Discharge: 2021-08-01 | Disposition: A | Discharge status: Home / Self Care | Payer: BC Managed Care – PPO | Source: Ambulatory Visit | Attending: Radiation Oncology | Admitting: Radiation Oncology

## 2021-08-01 DIAGNOSIS — Z79899 Other long term (current) drug therapy: Secondary | ICD-10-CM | POA: Insufficient documentation

## 2021-08-01 DIAGNOSIS — Z170 Estrogen receptor positive status [ER+]: Secondary | ICD-10-CM | POA: Diagnosis not present

## 2021-08-01 DIAGNOSIS — Z923 Personal history of irradiation: Secondary | ICD-10-CM | POA: Diagnosis not present

## 2021-08-01 DIAGNOSIS — C50411 Malignant neoplasm of upper-outer quadrant of right female breast: Secondary | ICD-10-CM | POA: Insufficient documentation

## 2021-08-01 NOTE — Progress Notes (Signed)
Karma Lew Gerstenberger is here today for follow up post radiation to the breast. ? ? Breast Side:Right ? ? ?They completed their radiation on: 07/01/21 ? ?Does the patient complain of any of the following: ?Post radiation skin issues: Patient reports skin has healed.  ?Breast Tenderness:Yes ?Breast Swelling: No ?Lymphadema: No ?Range of Motion limitations: No, patient demonstrates full range of motion   ?Fatigue post radiation:  No ?Appetite good/fair/poor: Good ? ?Additional comments if applicable: Patient reports numbness to right breast and axilla.  ? ?Vitals:  ? 08/01/21 1135  ?BP: 122/81  ?Pulse: 86  ?Resp: 20  ?Temp: (!) 97.5 ?F (36.4 ?C)  ?SpO2: 100%  ?Weight: 187 lb 3.2 oz (84.9 kg)  ?Height: 5' 2"  (1.575 m)  ?  ?

## 2021-08-07 ENCOUNTER — Other Ambulatory Visit: Payer: Self-pay | Admitting: *Deleted

## 2021-08-07 MED ORDER — EPINEPHRINE 0.3 MG/0.3ML IJ SOAJ: 0.3000 mg | Freq: Once | INTRAMUSCULAR | 1 refills | Status: DC

## 2021-08-07 NOTE — Telephone Encounter (Signed)
Patient called and stated that the insurance does not want to cover Auvi Q. She states that pharmacy said they will cover the generic EpiPen. I sent in generic prescription and asked patient to call me back if she needs anything further. Patient verbalized understanding.  ?

## 2021-10-08 ENCOUNTER — Inpatient Hospital Stay: Payer: BC Managed Care – PPO | Attending: Hematology | Admitting: Adult Health

## 2021-10-08 ENCOUNTER — Encounter: Payer: Self-pay | Admitting: Adult Health

## 2021-10-08 ENCOUNTER — Other Ambulatory Visit: Payer: Self-pay

## 2021-10-08 VITALS — BP 139/88 | HR 83 | Temp 97.7°F | Resp 18 | Ht 62.0 in | Wt 188.0 lb

## 2021-10-08 DIAGNOSIS — Z79811 Long term (current) use of aromatase inhibitors: Secondary | ICD-10-CM | POA: Diagnosis not present

## 2021-10-08 DIAGNOSIS — C50411 Malignant neoplasm of upper-outer quadrant of right female breast: Secondary | ICD-10-CM

## 2021-10-08 DIAGNOSIS — Z170 Estrogen receptor positive status [ER+]: Secondary | ICD-10-CM | POA: Diagnosis not present

## 2021-10-08 MED ORDER — EXEMESTANE 25 MG PO TABS: 25.0000 mg | ORAL_TABLET | Freq: Every day | ORAL | 3 refills | Status: DC

## 2021-10-08 NOTE — Progress Notes (Signed)
SURVIVORSHIP VISIT:   BRIEF ONCOLOGIC HISTORY:  Oncology History Overview Note   Cancer Staging  Malignant neoplasm of upper-outer quadrant of right breast in female, estrogen receptor positive ([place]) Staging form: Breast, AJCC 8th Edition - Clinical stage from 02/08/2021: Stage IA (cT1b, cN0, cM0, G2, ER+, PR+, HER2-) - Signed by Donna Merle, MD on 02/19/2021 - Pathologic stage from 04/04/2021: Stage IA (pT1c, pN0, cM0, G1, ER+, PR+, HER2-, Oncotype DX score: 14) - Signed by Donna Merle, MD on 07/08/2021     Malignant neoplasm of upper-outer quadrant of right breast in female, estrogen receptor positive ([place])  02/02/2021 Mammogram   Right Diagnostic MM and Right Breast US  IMPRESSION: 1.  Suspicious 0.8 cm mass in the right breast at the 12 position. 2. Suspicious calcifications adjacent to the right breast mass spanning 1.1 cm.  No lymphadenopathy seen in right axilla.   02/08/2021 Cancer Staging   Staging form: Breast, AJCC 8th Edition - Clinical stage from 02/08/2021: Stage IA (cT1b, cN0, cM0, G2, ER+, PR+, HER2-) - Signed by Donna Merle, MD on 02/19/2021 Stage prefix: Initial diagnosis Histologic grading system: 3 grade system   02/08/2021 Pathology Results   Diagnosis 1. Breast, right, needle core biopsy, 12 o'clock, ribbon shaped clip - INVASIVE DUCTAL CARCINOMA - DUCTAL CARCINOMA IN SITU - CALCIFICATIONS - SEE COMMENT 2. Breast, right, needle core biopsy, upper inner quadrant, x shaped clip - FLAT EPITHELIAL ATYPIA WITH CALCIFICATIONS - LOBULAR NEOPLASIA (ATYPICAL LOBULAR HYPERPLASIA) Microscopic Comment 1. Based on the biopsy, the carcinoma appears Nottingham grade 1-2 of 3 and measures 0.6 cm in greatest linear extent.  1. PROGNOSTIC INDICATORS Results: The tumor cells are EQUIVOCAL for Her2 (2+). Her2 by FISH will be performed and results reported separately. Estrogen Receptor: >95%, POSITIVE, STRONG STAINING INTENSITY Progesterone Receptor: >95%, POSITIVE, STRONG  STAINING INTENSITY Proliferation Marker Ki67: 1%  1. FLUORESCENCE IN-SITU HYBRIDIZATION Results: GROUP 5: HER2 **NEGATIVE**   02/15/2021 Initial Diagnosis   Malignant neoplasm of upper-outer quadrant of right breast in female, estrogen receptor positive ([place])   03/04/2021 Genetic Testing   Negative hereditary cancer genetic testing: no pathogenic variants detected in [facility] CustomNext-Cancer +RNAinsight Panel.  The report date is March 04, 2021.   The CustomNext-Cancer+RNAinsight panel offered by Donna Mcconnell includes sequencing and rearrangement analysis for the following 47 genes:  APC, ATM, AXIN2, BARD1, BMPR1A, BRCA1, BRCA2, BRIP1, CDH1, CDK4, CDKN2A, CHEK2, DICER1, EPCAM, GREM1, HOXB13, MEN1, MLH1, MSH2, MSH3, MSH6, MUTYH, NBN, NF1, NF2, NTHL1, PALB2, PMS2, POLD1, POLE, PTEN, RAD51C, RAD51D, RECQL, RET, SDHA, SDHAF2, SDHB, SDHC, SDHD, SMAD4, SMARCA4, STK11, TP53, TSC1, TSC2, and VHL.  RNA data is routinely analyzed for use in variant interpretation for all genes.   04/04/2021 Cancer Staging   Staging form: Breast, AJCC 8th Edition - Pathologic stage from 04/04/2021: Stage IA (pT1c, pN0, cM0, G1, ER+, PR+, HER2-, Oncotype DX score: 14) - Signed by Donna Merle, MD on 07/08/2021 Stage prefix: Initial diagnosis Multigene prognostic tests performed: Oncotype DX Recurrence score range: Greater than or equal to 11 Histologic grading system: 3 grade system   04/04/2021 Definitive Surgery   FINAL MICROSCOPIC DIAGNOSIS:   A. BREAST, RIGHT, LUMPECTOMY:  - Invasive ductal carcinoma, 1.1 cm, grade 1  - Ductal carcinoma in situ, low-grade  - Resection margins are negative for carcinoma - closest is the anterior  margin at 0.8 cm  - Atypical lobular hyperplasia  - Biopsy site changes  - See oncology table   B. LYMPH NODE, RIGHT AXILLA, SENTINEL, EXCISION:  - Lymph  node, negative for carcinoma (0/1)   C. LYMPH NODE, RIGHT AXILLA, SENTINEL, EXCISION:  - Lymph node, negative for carcinoma (0/1)    D. LYMPH NODE, RIGHT AXILLA, SENTINEL, EXCISION:  - Lymph node, negative for carcinoma (0/1)   E. BREAST, RIGHT POSTERIOR MARGIN, EXCISION:  - Atypical lobular hyperplasia    04/04/2021 Oncotype testing   Oncotype DX was obtained on the final surgical sample and the recurrence score of 14 predicts a risk of recurrence outside the breast over the next 9 years of 4%, if the patient's only systemic therapy is an antiestrogen for 5 years.  It also predicts no benefit from chemotherapy.    04/15/2021 Pathology Results   FINAL MICROSCOPIC DIAGNOSIS:   A. BREAST, RIGHT, MAMMOPLASTY:  - Benign mammary parenchyma with fat necrosis and giant cell reaction, 121 grams.  - Negative for atypical proliferative breast disease.   B. BREAST, LEFT, MAMMOPLASTY:  - Benign mammary parenchyma, 175 grams.  - Negative for atypical proliferative breast disease.   05/23/2021 - 07/01/2021 Radiation Therapy   Site Technique Total Dose (Gy) Dose per Fx (Gy) Completed Fx Beam Energies  Breast, Right: Breast_R 3D 50.4/50.4 1.8 28/28 6XFFF, 10XFFF     07/2021 -  Anti-estrogen oral therapy   Exemestane daily     INTERVAL HISTORY:  Donna Mcconnell to review her survivorship care plan detailing her treatment course for breast cancer, as well as monitoring long-term side effects of that treatment, education regarding health maintenance, screening, and overall wellness and health promotion.     Overall, Donna Mcconnell reports feeling quite well.  She is taking exemestane daily which she tolerates well.  She has noticed a longer she has been on it she has had increased joint pain and hot flashes.  She did undergo bone density testing on March 14, 2021 which showed normal bone density.  REVIEW OF SYSTEMS:  Review of Systems  Constitutional:  Negative for appetite change, chills, fatigue, fever and unexpected weight change.  HENT:   Negative for hearing loss, lump/mass and trouble swallowing.   Eyes:  Negative for eye  problems and icterus.  Respiratory:  Negative for chest tightness, cough and shortness of breath.   Cardiovascular:  Negative for chest pain, leg swelling and palpitations.  Gastrointestinal:  Negative for abdominal distention, abdominal pain, constipation, diarrhea, nausea and vomiting.  Endocrine: Negative for hot flashes.  Genitourinary:  Negative for difficulty urinating.   Musculoskeletal:  Negative for arthralgias.  Skin:  Negative for itching and rash.  Neurological:  Negative for dizziness, extremity weakness, headaches and numbness.  Hematological:  Negative for adenopathy. Does not bruise/bleed easily.  Psychiatric/Behavioral:  Negative for depression. The patient is not nervous/anxious.    Breast: Denies any new nodularity, masses, tenderness, nipple changes, or nipple discharge.      ONCOLOGY TREATMENT TEAM:  1. Surgeon:  Dr. Donne Hazel at [facility] 2. Medical Oncologist: Dr. Burr Medico  3. Radiation Oncologist: Dr. Sondra Come    PAST MEDICAL/SURGICAL HISTORY:  Past Medical History:  Diagnosis Date   Allergy    shellfish   Ameloblastoma of jaw    s/p left jaw tumor resection April 2022 with bone graft July 2022 (Dr. Raj Janus)   Breast cancer [facility])    Complication of anesthesia    Coronary atherosclerosis    COVID 03/2019   fever, bodyaches   Fatty liver disease, nonalcoholic    Food allergy    GERD (gastroesophageal reflux disease)    History of radiation therapy  right breast - 05/23/2021-07/01/2021  Dr Gery Pray   Hyperlipidemia    Hypertension    PONV (postoperative nausea and vomiting)    Spondylosis    Past Surgical History:  Procedure Laterality Date   ADENOIDECTOMY     BREAST LUMPECTOMY WITH RADIOACTIVE SEED AND SENTINEL LYMPH NODE BIOPSY Right 04/04/2021   Procedure: RIGHT BREAST LUMPECTOMY WITH RADIOACTIVE SEED AND AXILLARY SENTINEL LYMPH NODE BIOPSY;  Surgeon: Rolm Bookbinder, MD;  Location: [facility];  Service: General;  Laterality:  Right;   BREAST RECONSTRUCTION Right 04/15/2021   Procedure: RIGHT ONCOPLASTIC BREAST RECONSTRUCTION;  Surgeon: Irene Limbo, MD;  Location: [facility];  Service: Plastics;  Laterality: Right;   BREAST REDUCTION SURGERY Left 04/15/2021   Procedure: LEFT BREAST REDUCTION  (BREAST);  Surgeon: Irene Limbo, MD;  Location: [facility];  Service: Plastics;  Laterality: Left;   cercalage     COLONOSCOPY     10 yrs ago   MOUTH SURGERY  03/28/2021   TONSILLECTOMY     TYMPANOSTOMY TUBE PLACEMENT       ALLERGIES:  Allergies  Allergen Reactions   Shellfish Allergy     Mussels - Nausea and Vomiting Other Shellfish - welts      CURRENT MEDICATIONS:  Outpatient Encounter Medications as of 10/08/2021  Medication Sig   ASTAXANTHIN PO Take 16 mg by mouth daily.   CALCIUM-VITAMIN D PO Take 1 tablet by mouth daily.   chlorhexidine (PERIDEX) 0.12 % solution 15 mLs by Mouth Rinse route 2 (two) times daily.   pantoprazole (PROTONIX) 40 MG tablet Take 40 mg by mouth daily.   valsartan (DIOVAN) 80 MG tablet Take 80 mg by mouth daily.   [DISCONTINUED] exemestane (AROMASIN) 25 MG tablet Take 1 tablet (25 mg total) by mouth daily after breakfast.   EPINEPHrine (EPIPEN 2-PAK) 0.3 mg/0.3 mL IJ SOAJ injection Inject 0.3 mg into the muscle once for 1 dose.   exemestane (AROMASIN) 25 MG tablet Take 1 tablet (25 mg total) by mouth daily after breakfast.   [DISCONTINUED] EPINEPHrine (AUVI-Q) 0.3 mg/0.3 mL IJ SOAJ injection Inject 0.3 mg into the muscle as needed for anaphylaxis.   No facility-administered encounter medications on file as of 10/08/2021.     ONCOLOGIC FAMILY HISTORY:  Family History  Problem Relation Age of Onset   Heart attack Mother 21       deceased   Early death Mother    Heart disease Mother    Obesity Mother    Cancer Father 69       unknown primary; mets   Obesity Father    Varicose Veins Father    Obesity Maternal Grandmother    Prostate cancer Maternal Grandfather 75        metastatic   Arthritis Maternal Grandfather    Obesity Paternal Grandmother    Stroke Paternal Grandmother    Varicose Veins Paternal Grandmother    Arthritis Paternal Grandfather    Cancer Paternal Grandfather 73       unknown type; ? lung   Breast cancer Other        MGF's mother; dx unknown age   Colon cancer Neg Hx    Colon polyps Neg Hx    Esophageal cancer Neg Hx    Stomach cancer Neg Hx    Rectal cancer Neg Hx      SOCIAL HISTORY:  Social History   Socioeconomic History   Marital status: Married    Spouse name: Not on file   Number of children: 2  Years of education: Not on file   Highest education level: Not on file  Occupational History   Not on file  Tobacco Use   Smoking status: Never   Smokeless tobacco: Never   Tobacco comments:    smoked in college  Vaping Use   Vaping Use: Never used  Substance and Sexual Activity   Alcohol use: Yes    Alcohol/week: 5.0 standard drinks of alcohol    Types: 5 Glasses of wine per week    Comment: I have not had much wine since July.   Drug use: Never   Sexual activity: Yes    Birth control/protection: Post-menopausal  Other Topics Concern   Not on file  Social History Narrative   Not on file   Social Determinants of Health   Financial Resource Strain: Not on file  Food Insecurity: Not on file  Transportation Needs: Not on file  Physical Activity: Not on file  Stress: Not on file  Social Connections: Not on file  Intimate Partner Violence: Not on file     OBSERVATIONS/OBJECTIVE:  BP 139/88 (BP Location: Left Arm, Patient Position: Sitting)   Pulse 83   Temp 97.7 F (36.5 C) (Temporal)   Resp 18   Ht 5' 2"  (1.575 m)   Wt 188 lb (85.3 kg)   SpO2 97%   BMI 34.39 kg/m  GENERAL: Patient is a well appearing female in no acute distress HEENT:  Sclerae anicteric.  Oropharynx clear and moist. No ulcerations or evidence of oropharyngeal candidiasis. Neck is supple.  NODES:  No cervical, supraclavicular, or  axillary lymphadenopathy palpated.  BREAST EXAM: Both breasts are status post reduction right breast has undergone radiation there is no sign of local recurrence breasts are benign. LUNGS:  Clear to auscultation bilaterally.  No wheezes or rhonchi. HEART:  Regular rate and rhythm. No murmur appreciated. ABDOMEN:  Soft, nontender.  Positive, normoactive bowel sounds. No organomegaly palpated. MSK:  No focal spinal tenderness to palpation. Full range of motion bilaterally in the upper extremities. EXTREMITIES:  No peripheral edema.   SKIN:  Clear with no obvious rashes or skin changes. No nail dyscrasia. NEURO:  Nonfocal. Well oriented.  Appropriate affect.   LABORATORY DATA:  None for this visit.  DIAGNOSTIC IMAGING:  None for this visit.      ASSESSMENT AND PLAN:  Ms.. Marsan is a pleasant 61 y.o. female with Stage 1A right/ breast invasive ductal carcinoma, ER+/PR+/HER2-, diagnosed in 01/2021, treated with lumpectomy, adjuvant radiation therapy, and anti-estrogen therapy with exemestane beginning in 07/2021.  She presents to the Survivorship Clinic for our initial meeting and routine follow-up post-completion of treatment for breast cancer.    1. Stage 1A right breast cancer:  Ms. Siebers is continuing to recover from definitive treatment for breast cancer. She will follow-up with her medical oncologist, Dr. Burr Medico in 3 months with history and physical exam per surveillance protocol.  She will continue her anti-estrogen therapy with Exemestane. Thus far, she is tolerating the exemestane well, with minimal side effects. S we discussed that sometimes glucosamine conjoint and can help with joint aches and thatLume is a good deodorant for sweating that does not contain aluminum.  Her mammogram is due September 2023; orders placed today.. Today, a comprehensive survivorship care plan and treatment summary was reviewed with the patient today detailing her breast cancer diagnosis, treatment course,  potential late/long-term effects of treatment, appropriate follow-up care with recommendations for the future, and patient education resources.  A copy of this  summary, along with a letter will be sent to the patient's primary care provider via mail/fax/In Basket message after today's visit.    2. Bone health:  Given Ms. Lindeman's age/history of breast cancer and her current treatment regimen including anti-estrogen therapy with exemestane, she is at risk for bone demineralization.  Her last DEXA scan was November 2022 and it was normal.  She was given education on specific activities to promote bone health.  3. Cancer screening:  Due to Ms. Lea's history and her age, she should receive screening for skin cancers, colon cancer, and gynecologic cancers.  The information and recommendations are listed on the patient's comprehensive care plan/treatment summary and were reviewed in detail with the patient.    4. Health maintenance and wellness promotion: Ms. Donlon was encouraged to consume 5-7 servings of fruits and vegetables per day. We reviewed the "Nutrition Rainbow" handout.  She was also encouraged to engage in moderate to vigorous exercise for 30 minutes per day most days of the week. We discussed the [employer] fitness program, which is designed for cancer survivors to help them become more physically fit after cancer treatments.  She was instructed to limit her alcohol consumption and continue to abstain from tobacco use.     #. Support services/counseling: It is not uncommon for this period of the patient's cancer care trajectory to be one of many emotions and stressors.  She was given information regarding our available services and encouraged to contact me with any questions or for help enrolling in any of our support group/programs.    Follow up instructions:    -Return to cancer center 12/2021 for f/u with Dr. Burr Medico  -Mammogram due in 12/2021 -Bone Density 02/2023 -Follow up with surgery  06/2022 -She is welcome to return back to the Survivorship Clinic at any time; no additional follow-up needed at this time.  -Consider referral back to survivorship as a long-term survivor for continued surveillance  The patient was provided an opportunity to ask questions and all were answered. The patient agreed with the plan and demonstrated an understanding of the instructions.   Total encounter time:45 minutes*in face-to-face visit time, chart review, lab review, care coordination, order entry, and documentation of the encounter time.    Wilber Bihari, NP 10/08/21 3:13 PM Medical Oncology and Hematology [facility] [place], [place] [address] Tel. [phone]    Fax. [phone]  *Total Encounter Time as defined by the Centers for Medicare and Medicaid Services includes, in addition to the face-to-face time of a patient visit (documented in the note above) non-face-to-face time: obtaining and reviewing outside history, ordering and reviewing medications, tests or procedures, care coordination (communications with other health care professionals or caregivers) and documentation in the medical record.

## 2021-10-18 DIAGNOSIS — Z923 Personal history of irradiation: Secondary | ICD-10-CM | POA: Diagnosis not present

## 2021-10-18 DIAGNOSIS — Z853 Personal history of malignant neoplasm of breast: Secondary | ICD-10-CM | POA: Diagnosis not present

## 2021-10-21 ENCOUNTER — Ambulatory Visit: Payer: BC Managed Care – PPO | Attending: General Surgery

## 2021-10-21 VITALS — Wt 185.4 lb

## 2021-10-21 DIAGNOSIS — Z483 Aftercare following surgery for neoplasm: Secondary | ICD-10-CM | POA: Insufficient documentation

## 2021-11-12 DIAGNOSIS — Z428 Encounter for other plastic and reconstructive surgery following medical procedure or healed injury: Secondary | ICD-10-CM | POA: Diagnosis not present

## 2021-11-12 DIAGNOSIS — D165 Benign neoplasm of lower jaw bone: Secondary | ICD-10-CM | POA: Diagnosis not present

## 2021-11-12 DIAGNOSIS — M272 Inflammatory conditions of jaws: Secondary | ICD-10-CM | POA: Diagnosis not present

## 2022-01-07 ENCOUNTER — Other Ambulatory Visit: Payer: Self-pay

## 2022-01-07 DIAGNOSIS — Z170 Estrogen receptor positive status [ER+]: Secondary | ICD-10-CM

## 2022-01-08 ENCOUNTER — Inpatient Hospital Stay: Payer: BC Managed Care – PPO | Attending: Hematology

## 2022-01-08 ENCOUNTER — Inpatient Hospital Stay (HOSPITAL_BASED_OUTPATIENT_CLINIC_OR_DEPARTMENT_OTHER): Payer: BC Managed Care – PPO | Admitting: Hematology

## 2022-01-08 ENCOUNTER — Encounter: Payer: Self-pay | Admitting: Hematology

## 2022-01-08 VITALS — BP 137/75 | HR 76 | Temp 98.2°F | Resp 18 | Ht 62.0 in | Wt 180.9 lb

## 2022-01-08 DIAGNOSIS — C50411 Malignant neoplasm of upper-outer quadrant of right female breast: Secondary | ICD-10-CM

## 2022-01-08 DIAGNOSIS — Z923 Personal history of irradiation: Secondary | ICD-10-CM | POA: Diagnosis not present

## 2022-01-08 DIAGNOSIS — Z170 Estrogen receptor positive status [ER+]: Secondary | ICD-10-CM | POA: Diagnosis not present

## 2022-01-08 DIAGNOSIS — Z79899 Other long term (current) drug therapy: Secondary | ICD-10-CM | POA: Insufficient documentation

## 2022-01-08 DIAGNOSIS — N951 Menopausal and female climacteric states: Secondary | ICD-10-CM | POA: Insufficient documentation

## 2022-01-08 DIAGNOSIS — Z79811 Long term (current) use of aromatase inhibitors: Secondary | ICD-10-CM | POA: Diagnosis not present

## 2022-01-08 LAB — COMPREHENSIVE METABOLIC PANEL
ALT: 11 U/L (ref 0–44)
AST: 22 U/L (ref 15–41)
Albumin: 4.4 g/dL (ref 3.5–5.0)
Alkaline Phosphatase: 112 U/L (ref 38–126)
Anion gap: 6 (ref 5–15)
BUN: 18 mg/dL (ref 8–23)
CO2: 27 mmol/L (ref 22–32)
Calcium: 10.1 mg/dL (ref 8.9–10.3)
Chloride: 104 mmol/L (ref 98–111)
Creatinine, Ser: 0.76 mg/dL (ref 0.44–1.00)
GFR, Estimated: >60 mL/min (ref >60)
Glucose, Bld: 120 mg/dL — ABNORMAL HIGH (ref 70–99)
Potassium: 4 mmol/L (ref 3.5–5.1)
Sodium: 137 mmol/L (ref 135–145)
Total Bilirubin: 1 mg/dL (ref 0.3–1.2)
Total Protein: 7 g/dL (ref 6.5–8.1)

## 2022-01-08 LAB — CBC WITH DIFFERENTIAL/PLATELET
Abs Immature Granulocytes: 0.02 10*3/uL (ref 0.00–0.07)
Basophils Absolute: 0 10*3/uL (ref 0.0–0.1)
Basophils Relative: 1 %
Eosinophils Absolute: 0.1 10*3/uL (ref 0.0–0.5)
Eosinophils Relative: 2 %
HCT: 38.4 % (ref 36.0–46.0)
Hemoglobin: 12.7 g/dL (ref 12.0–15.0)
Immature Granulocytes: 0 %
Lymphocytes Relative: 19 %
Lymphs Abs: 1.2 10*3/uL (ref 0.7–4.0)
MCH: 28.7 pg (ref 26.0–34.0)
MCHC: 33.1 g/dL (ref 30.0–36.0)
MCV: 86.9 fL (ref 80.0–100.0)
Monocytes Absolute: 0.5 10*3/uL (ref 0.1–1.0)
Monocytes Relative: 9 %
Neutro Abs: 4.2 10*3/uL (ref 1.7–7.7)
Neutrophils Relative %: 69 %
Platelets: 318 10*3/uL (ref 150–400)
RBC: 4.42 MIL/uL (ref 3.87–5.11)
RDW: 15.1 % (ref 11.5–15.5)
WBC: 6.1 10*3/uL (ref 4.0–10.5)
nRBC: 0 % (ref 0.0–0.2)

## 2022-01-08 NOTE — Progress Notes (Signed)
[place]   [phone] [phone]   Clinic Follow up Note   Patient Care Team: Shon Baton, MD as PCP - General (Internal Medicine) Rolm Bookbinder, MD as Consulting Physician (General Surgery) Truitt Merle, MD as Consulting Physician (Hematology) Gery Pray, MD as Consulting Physician (Radiation Oncology)  Date of Service:  01/08/2022  CHIEF COMPLAINT: f/u of right breast cancer  CURRENT THERAPY:  Exemestane, starting 07/2021  ASSESSMENT & PLAN:  Donna Mcconnell is a 61 y.o. post-menopausal female with   1. Malignant neoplasm of upper-outer quadrant of right breast, IDC and DCIS, Stage IA, p(T1b, N0), ER+/PR+/HER2-, Grade 2  -found on screening mammogram. S/p right lumpectomy on 04/04/21 with Dr. Donne Hazel showed: 1.1 cm IDC and DCIS, both grade 1; ALH. Margins and lymph nodes negative. -Oncotype RS of 14, low risk. -s/p radiation therapy under Dr. Sondra Come 1/26-07/01/21. -she started exemestane in early 07/2021. She is tolerating well with manageable hot flashes and joint stiffness in her hands. -she is clinically doing well. Labs reviewed, all WNL. Physical exam was unremarkable. There is no clinical concern for recurrence. -next mammogram scheduled for next week, 01/15/22.   2. Hot flashes, Joint pain in hands -secondary to exemestane -her hot flashes are manageable at this time -she reports new stiffness and numbness in her hands. I encouraged her to continue to exercise, and she can continue tumeric and try glucosamine.  3. Bone Health  -DEXA on 03/14/21 was normal, with lowest T-score of -0.7 at left radius. -plan to repeat every 2 years on exemestane     PLAN:  -continue exemestane -lab and f/u in 6 months -I reviewed her supplement, diet and exercise     No problem-specific Assessment & Plan notes found for this encounter.   SUMMARY OF ONCOLOGIC HISTORY: Oncology History Overview Note   Cancer Staging  Malignant neoplasm of  upper-outer quadrant of right breast in female, estrogen receptor positive ([place]) Staging form: Breast, AJCC 8th Edition - Clinical stage from 02/08/2021: Stage IA (cT1b, cN0, cM0, G2, ER+, PR+, HER2-) - Signed by Truitt Merle, MD on 02/19/2021 - Pathologic stage from 04/04/2021: Stage IA (pT1c, pN0, cM0, G1, ER+, PR+, HER2-, Oncotype DX score: 14) - Signed by Truitt Merle, MD on 07/08/2021     Malignant neoplasm of upper-outer quadrant of right breast in female, estrogen receptor positive ([place])  02/02/2021 Mammogram   Right Diagnostic MM and Right Breast US  IMPRESSION: 1.  Suspicious 0.8 cm mass in the right breast at the 12 position. 2. Suspicious calcifications adjacent to the right breast mass spanning 1.1 cm.  No lymphadenopathy seen in right axilla.   02/08/2021 Cancer Staging   Staging form: Breast, AJCC 8th Edition - Clinical stage from 02/08/2021: Stage IA (cT1b, cN0, cM0, G2, ER+, PR+, HER2-) - Signed by Truitt Merle, MD on 02/19/2021 Stage prefix: Initial diagnosis Histologic grading system: 3 grade system   02/08/2021 Pathology Results   Diagnosis 1. Breast, right, needle core biopsy, 12 o'clock, ribbon shaped clip - INVASIVE DUCTAL CARCINOMA - DUCTAL CARCINOMA IN SITU - CALCIFICATIONS - SEE COMMENT 2. Breast, right, needle core biopsy, upper inner quadrant, x shaped clip - FLAT EPITHELIAL ATYPIA WITH CALCIFICATIONS - LOBULAR NEOPLASIA (ATYPICAL LOBULAR HYPERPLASIA) Microscopic Comment 1. Based on the biopsy, the carcinoma appears Nottingham grade 1-2 of 3 and measures 0.6 cm in greatest linear extent.  1. PROGNOSTIC INDICATORS Results: The tumor cells are EQUIVOCAL for Her2 (2+). Her2 by FISH will be performed and results reported separately.  Estrogen Receptor: >95%, POSITIVE, STRONG STAINING INTENSITY Progesterone Receptor: >95%, POSITIVE, STRONG STAINING INTENSITY Proliferation Marker Ki67: 1%  1. FLUORESCENCE IN-SITU HYBRIDIZATION Results: GROUP 5: HER2 **NEGATIVE**    02/15/2021 Initial Diagnosis   Malignant neoplasm of upper-outer quadrant of right breast in female, estrogen receptor positive ([place])   03/04/2021 Genetic Testing   Negative hereditary cancer genetic testing: no pathogenic variants detected in [facility] CustomNext-Cancer +RNAinsight Panel.  The report date is March 04, 2021.   The CustomNext-Cancer+RNAinsight panel offered by [facility] includes sequencing and rearrangement analysis for the following 47 genes:  APC, ATM, AXIN2, BARD1, BMPR1A, BRCA1, BRCA2, BRIP1, CDH1, CDK4, CDKN2A, CHEK2, DICER1, EPCAM, GREM1, HOXB13, MEN1, MLH1, MSH2, MSH3, MSH6, MUTYH, NBN, NF1, NF2, NTHL1, PALB2, PMS2, POLD1, POLE, PTEN, RAD51C, RAD51D, RECQL, RET, SDHA, SDHAF2, SDHB, SDHC, SDHD, SMAD4, SMARCA4, STK11, TP53, TSC1, TSC2, and VHL.  RNA data is routinely analyzed for use in variant interpretation for all genes.   04/04/2021 Cancer Staging   Staging form: Breast, AJCC 8th Edition - Pathologic stage from 04/04/2021: Stage IA (pT1c, pN0, cM0, G1, ER+, PR+, HER2-, Oncotype DX score: 14) - Signed by Truitt Merle, MD on 07/08/2021 Stage prefix: Initial diagnosis Multigene prognostic tests performed: Oncotype DX Recurrence score range: Greater than or equal to 11 Histologic grading system: 3 grade system   04/04/2021 Definitive Surgery   FINAL MICROSCOPIC DIAGNOSIS:   A. BREAST, RIGHT, LUMPECTOMY:  - Invasive ductal carcinoma, 1.1 cm, grade 1  - Ductal carcinoma in situ, low-grade  - Resection margins are negative for carcinoma - closest is the anterior  margin at 0.8 cm  - Atypical lobular hyperplasia  - Biopsy site changes  - See oncology table   B. LYMPH NODE, RIGHT AXILLA, SENTINEL, EXCISION:  - Lymph node, negative for carcinoma (0/1)   C. LYMPH NODE, RIGHT AXILLA, SENTINEL, EXCISION:  - Lymph node, negative for carcinoma (0/1)   D. LYMPH NODE, RIGHT AXILLA, SENTINEL, EXCISION:  - Lymph node, negative for carcinoma (0/1)   E. BREAST, RIGHT POSTERIOR  MARGIN, EXCISION:  - Atypical lobular hyperplasia    04/04/2021 Oncotype testing   Oncotype DX was obtained on the final surgical sample and the recurrence score of 14 predicts a risk of recurrence outside the breast over the next 9 years of 4%, if the patient's only systemic therapy is an antiestrogen for 5 years.  It also predicts no benefit from chemotherapy.    04/15/2021 Pathology Results   FINAL MICROSCOPIC DIAGNOSIS:   A. BREAST, RIGHT, MAMMOPLASTY:  - Benign mammary parenchyma with fat necrosis and giant cell reaction, 121 grams.  - Negative for atypical proliferative breast disease.   B. BREAST, LEFT, MAMMOPLASTY:  - Benign mammary parenchyma, 175 grams.  - Negative for atypical proliferative breast disease.   05/23/2021 - 07/01/2021 Radiation Therapy   Site Technique Total Dose (Gy) Dose per Fx (Gy) Completed Fx Beam Energies  Breast, Right: Breast_R 3D 50.4/50.4 1.8 28/28 6XFFF, 10XFFF     07/2021 -  Anti-estrogen oral therapy   Exemestane daily      INTERVAL HISTORY:  Donna Mcconnell is here for a follow up of breast cancer. She was last seen by me on 07/08/21. She presents to the clinic alone. She reports she is doing well overall, but she does report she is experiencing menopause symptoms. She explains her hot flashes are manageable, and she is using supplements (such a tumeric and a vaginal suppository) to manage her other symptoms. She tells me that getting a new  bed "has made all the difference in the world." She explains this has helped her back pain tremendously. She reports she experiences some stiffness and numbness to her hands.   All other systems were reviewed with the patient and are negative.  MEDICAL HISTORY:  Past Medical History:  Diagnosis Date   Allergy    shellfish   Ameloblastoma of jaw    s/p left jaw tumor resection April 2022 with bone graft July 2022 (Dr. Raj Janus)   Breast cancer [facility])    Complication of anesthesia    Coronary  atherosclerosis    COVID 03/2019   fever, bodyaches   Fatty liver disease, nonalcoholic    Food allergy    GERD (gastroesophageal reflux disease)    History of radiation therapy    right breast - 05/23/2021-07/01/2021  Dr Gery Pray   Hyperlipidemia    Hypertension    PONV (postoperative nausea and vomiting)    Spondylosis     SURGICAL HISTORY: Past Surgical History:  Procedure Laterality Date   ADENOIDECTOMY     BREAST LUMPECTOMY WITH RADIOACTIVE SEED AND SENTINEL LYMPH NODE BIOPSY Right 04/04/2021   Procedure: RIGHT BREAST LUMPECTOMY WITH RADIOACTIVE SEED AND AXILLARY SENTINEL LYMPH NODE BIOPSY;  Surgeon: Rolm Bookbinder, MD;  Location: [place];  Service: General;  Laterality: Right;   BREAST RECONSTRUCTION Right 04/15/2021   Procedure: RIGHT ONCOPLASTIC BREAST RECONSTRUCTION;  Surgeon: Irene Limbo, MD;  Location: [place];  Service: Plastics;  Laterality: Right;   BREAST REDUCTION SURGERY Left 04/15/2021   Procedure: LEFT BREAST REDUCTION  (BREAST);  Surgeon: Irene Limbo, MD;  Location: [place];  Service: Plastics;  Laterality: Left;   cercalage     COLONOSCOPY     10 yrs ago   MOUTH SURGERY  03/28/2021   TONSILLECTOMY     TYMPANOSTOMY TUBE PLACEMENT      I have reviewed the social history and family history with the patient and they are unchanged from previous note.  ALLERGIES:  is allergic to shellfish allergy.  MEDICATIONS:  Current Outpatient Medications  Medication Sig Dispense Refill   ASTAXANTHIN PO Take 16 mg by mouth daily.     CALCIUM-VITAMIN D PO Take 1 tablet by mouth daily.     chlorhexidine (PERIDEX) 0.12 % solution 15 mLs by Mouth Rinse route 2 (two) times daily.     EPINEPHrine (EPIPEN 2-PAK) 0.3 mg/0.3 mL IJ SOAJ injection Inject 0.3 mg into the muscle once for 1 dose. 0.3 mL 1   exemestane (AROMASIN) 25 MG tablet Take 1 tablet (25 mg total) by mouth daily after breakfast. 90 tablet 3   pantoprazole (PROTONIX) 40 MG tablet Take 40 mg by mouth  daily.     valsartan (DIOVAN) 80 MG tablet Take 80 mg by mouth daily.     No current facility-administered medications for this visit.    PHYSICAL EXAMINATION: ECOG PERFORMANCE STATUS: 0 - Asymptomatic  Vitals:   01/08/22 1203  BP: 137/75  Pulse: 76  Resp: 18  Temp: 98.2 F (36.8 C)  SpO2: 100%   Wt Readings from Last 3 Encounters:  01/08/22 180 lb 14.4 oz (82.1 kg)  10/21/21 185 lb 6 oz (84.1 kg)  10/08/21 188 lb (85.3 kg)     GENERAL:alert, no distress and comfortable SKIN: skin color, texture, turgor are normal, no rashes or significant lesions EYES: normal, Conjunctiva are pink and non-injected, sclera clear  NECK: supple, thyroid normal size, non-tender, without nodularity LYMPH:  no palpable lymphadenopathy in the cervical, axillary LUNGS:  clear to auscultation and percussion with normal breathing effort HEART: regular rate & rhythm and no murmurs and no lower extremity edema ABDOMEN:abdomen soft, non-tender and normal bowel sounds Musculoskeletal:no cyanosis of digits and no clubbing  NEURO: alert & oriented x 3 with fluent speech, no focal motor/sensory deficits BREAST: No palpable mass, nodules or adenopathy bilaterally. Breast exam benign.   LABORATORY DATA:  I have reviewed the data as listed    Latest Ref Rng & Units 01/08/2022   11:14 AM 03/28/2021   10:26 AM 02/20/2021   12:25 PM  CBC  WBC 4.0 - 10.5 K/uL 6.1  5.6  4.0   Hemoglobin 12.0 - 15.0 g/dL 12.7  13.9  13.7   Hematocrit 36.0 - 46.0 % 38.4  44.2  41.0   Platelets 150 - 400 K/uL 318  310  289         Latest Ref Rng & Units 01/08/2022   11:32 AM 03/28/2021   10:26 AM 02/20/2021   12:25 PM  CMP  Glucose 70 - 99 mg/dL 120  114  126   BUN 8 - 23 mg/dL 18  14  11    Creatinine 0.44 - 1.00 mg/dL 0.76  0.77  0.80   Sodium 135 - 145 mmol/L 137  139  138   Potassium 3.5 - 5.1 mmol/L 4.0  3.9  3.9   Chloride 98 - 111 mmol/L 104  103  106   CO2 22 - 32 mmol/L 27  27  22    Calcium 8.9 - 10.3 mg/dL  10.1  9.4  9.7   Total Protein 6.5 - 8.1 g/dL 7.0  7.2  7.3   Total Bilirubin 0.3 - 1.2 mg/dL 1.0  0.7  1.0   Alkaline Phos 38 - 126 U/L 112  75  78   AST 15 - 41 U/L 22  38  33   ALT 0 - 44 U/L 11  21  21        RADIOGRAPHIC STUDIES: I have personally reviewed the radiological images as listed and agreed with the findings in the report. No results found.    No orders of the defined types were placed in this encounter.  All questions were answered. The patient knows to call the clinic with any problems, questions or concerns. No barriers to learning was detected. The total time spent in the appointment was 30 minutes.     Truitt Merle, MD 01/08/2022   I, Wilburn Mylar, am acting as scribe for Truitt Merle, MD.   I have reviewed the above documentation for accuracy and completeness, and I agree with the above.

## 2022-01-15 ENCOUNTER — Other Ambulatory Visit: Payer: Self-pay | Admitting: Adult Health

## 2022-01-15 ENCOUNTER — Ambulatory Visit
Admission: RE | Admit: 2022-01-15 | Discharge: 2022-01-15 | Disposition: A | Discharge status: Home / Self Care | Payer: BC Managed Care – PPO | Source: Ambulatory Visit | Attending: Adult Health | Admitting: Adult Health

## 2022-01-15 DIAGNOSIS — Z170 Estrogen receptor positive status [ER+]: Secondary | ICD-10-CM

## 2022-01-15 DIAGNOSIS — R921 Mammographic calcification found on diagnostic imaging of breast: Secondary | ICD-10-CM

## 2022-01-15 DIAGNOSIS — N6320 Unspecified lump in the left breast, unspecified quadrant: Secondary | ICD-10-CM

## 2022-01-28 ENCOUNTER — Ambulatory Visit
Admission: RE | Admit: 2022-01-28 | Discharge: 2022-01-28 | Disposition: A | Discharge status: Home / Self Care | Payer: BC Managed Care – PPO | Source: Ambulatory Visit | Attending: Adult Health | Admitting: Adult Health

## 2022-01-28 DIAGNOSIS — N641 Fat necrosis of breast: Secondary | ICD-10-CM | POA: Diagnosis not present

## 2022-01-28 DIAGNOSIS — R921 Mammographic calcification found on diagnostic imaging of breast: Secondary | ICD-10-CM

## 2022-01-28 DIAGNOSIS — N6323 Unspecified lump in the left breast, lower outer quadrant: Secondary | ICD-10-CM

## 2022-01-28 DIAGNOSIS — N6012 Diffuse cystic mastopathy of left breast: Secondary | ICD-10-CM | POA: Diagnosis not present

## 2022-01-28 DIAGNOSIS — N6324 Unspecified lump in the left breast, lower inner quadrant: Secondary | ICD-10-CM | POA: Diagnosis not present

## 2022-01-28 DIAGNOSIS — N6320 Unspecified lump in the left breast, unspecified quadrant: Secondary | ICD-10-CM

## 2022-02-03 ENCOUNTER — Ambulatory Visit: Payer: BC Managed Care – PPO | Attending: General Surgery

## 2022-02-03 VITALS — Wt 185.4 lb

## 2022-02-03 DIAGNOSIS — Z483 Aftercare following surgery for neoplasm: Secondary | ICD-10-CM | POA: Insufficient documentation

## 2022-02-03 NOTE — Therapy (Signed)
OUTPATIENT PHYSICAL THERAPY SOZO SCREENING NOTE   Patient Name: Donna Mcconnell MRN: [id number] DOB:01-10-1961, 61 y.o., female Today's Date: 02/03/2022  PCP: Shon Baton, MD REFERRING PROVIDER: Rolm Bookbinder, MD   PT End of Session - 02/03/22 [phone]     Visit Number 2   # unchanged due to screen only   PT Start Time 0929    PT Stop Time 0934    PT Time Calculation (min) 5 min    Activity Tolerance Patient tolerated treatment well    Behavior During Therapy [facility] for tasks assessed/performed             Past Medical History:  Diagnosis Date   Allergy    shellfish   Ameloblastoma of jaw    s/p left jaw tumor resection April 2022 with bone graft July 2022 (Dr. Raj Janus)   Breast cancer [facility])    Complication of anesthesia    Coronary atherosclerosis    COVID 03/2019   fever, bodyaches   Fatty liver disease, nonalcoholic    Food allergy    GERD (gastroesophageal reflux disease)    History of radiation therapy    right breast - 05/23/2021-07/01/2021  Dr Gery Pray   Hyperlipidemia    Hypertension    PONV (postoperative nausea and vomiting)    Spondylosis    Past Surgical History:  Procedure Laterality Date   ADENOIDECTOMY     BREAST LUMPECTOMY WITH RADIOACTIVE SEED AND SENTINEL LYMPH NODE BIOPSY Right 04/04/2021   Procedure: RIGHT BREAST LUMPECTOMY WITH RADIOACTIVE SEED AND AXILLARY SENTINEL LYMPH NODE BIOPSY;  Surgeon: Rolm Bookbinder, MD;  Location: [place];  Service: General;  Laterality: Right;   BREAST RECONSTRUCTION Right 04/15/2021   Procedure: RIGHT ONCOPLASTIC BREAST RECONSTRUCTION;  Surgeon: Irene Limbo, MD;  Location: [place];  Service: Plastics;  Laterality: Right;   BREAST REDUCTION SURGERY Left 04/15/2021   Procedure: LEFT BREAST REDUCTION  (BREAST);  Surgeon: Irene Limbo, MD;  Location: [place];  Service: Plastics;  Laterality: Left;   cercalage     COLONOSCOPY     10 yrs ago   MOUTH SURGERY  03/28/2021   TONSILLECTOMY     TYMPANOSTOMY  TUBE PLACEMENT     Patient Active Problem List   Diagnosis Date Noted   Genetic testing 03/05/2021   Family history of breast cancer 02/20/2021   Family history of prostate cancer 02/20/2021   Malignant neoplasm of upper-outer quadrant of right breast in female, estrogen receptor positive ([place]) 02/15/2021    REFERRING DIAG: Rt breast cancer  THERAPY DIAG: Aftercare following surgery for neoplasm  PERTINENT HISTORY: Patient was diagnosed on 01/14/2021 with right grade I-II invasive ductal carcinoma breast cancer. It measures 8 mm and has an area od calicifications measuring 1.8 cm in the upper outer quadrant.. It is ER/PR positive and HER2 negative with a Ki67 of 1%. She has a hx of a mandibular surgery due to a benign tumor in 07/2020 and had to have a bone graft in 10/2020. She is missing multiple teeth on the bottom left side.  PRECAUTIONS: lymphedema risk Rt UE  SUBJECTIVE: Pt returns for hre 3 month L-Dex screen.   PAIN:  Are you having pain? No  SOZO SCREENING: Patient was assessed today using the SOZO machine to determine the lymphedema index score. This was compared to her baseline score. It was determined that she is within the recommended range when compared to her baseline and no further action is needed at this time. She will return in 3  months for her next SOZO screen.   L-DEX FLOWSHEETS - 02/03/22 0900       L-DEX LYMPHEDEMA SCREENING   Measurement Type Unilateral    L-DEX MEASUREMENT EXTREMITY Upper Extremity    POSITION  Standing    DOMINANT SIDE Right    At Risk Side Right    BASELINE SCORE (UNILATERAL) -3.9    L-DEX SCORE (UNILATERAL) -5.8    VALUE CHANGE (UNILAT) -1.9              Otelia Limes, PTA 02/03/2022, 9:34 AM

## 2022-03-03 DIAGNOSIS — Z1211 Encounter for screening for malignant neoplasm of colon: Secondary | ICD-10-CM | POA: Diagnosis not present

## 2022-03-03 DIAGNOSIS — C50919 Malignant neoplasm of unspecified site of unspecified female breast: Secondary | ICD-10-CM | POA: Diagnosis not present

## 2022-03-03 DIAGNOSIS — Z01419 Encounter for gynecological examination (general) (routine) without abnormal findings: Secondary | ICD-10-CM | POA: Diagnosis not present

## 2022-03-03 DIAGNOSIS — Z6834 Body mass index (BMI) 34.0-34.9, adult: Secondary | ICD-10-CM | POA: Diagnosis not present

## 2022-03-03 DIAGNOSIS — N952 Postmenopausal atrophic vaginitis: Secondary | ICD-10-CM | POA: Diagnosis not present

## 2022-04-23 DIAGNOSIS — Z853 Personal history of malignant neoplasm of breast: Secondary | ICD-10-CM | POA: Diagnosis not present

## 2022-04-23 DIAGNOSIS — Z923 Personal history of irradiation: Secondary | ICD-10-CM | POA: Diagnosis not present

## 2022-04-29 DIAGNOSIS — C50411 Malignant neoplasm of upper-outer quadrant of right female breast: Secondary | ICD-10-CM | POA: Diagnosis not present

## 2022-04-29 DIAGNOSIS — Z170 Estrogen receptor positive status [ER+]: Secondary | ICD-10-CM | POA: Diagnosis not present

## 2022-05-19 ENCOUNTER — Ambulatory Visit: Payer: BC Managed Care – PPO | Attending: General Surgery

## 2022-05-19 VITALS — Wt 186.1 lb

## 2022-05-19 DIAGNOSIS — Z483 Aftercare following surgery for neoplasm: Secondary | ICD-10-CM | POA: Insufficient documentation

## 2022-05-19 NOTE — Therapy (Signed)
OUTPATIENT PHYSICAL THERAPY SOZO SCREENING NOTE   Patient Name: Donna Mcconnell MRN: [id number] DOB:01-08-1961, 62 y.o., female Today's Date: 05/19/2022  PCP: Shon Baton, MD REFERRING PROVIDER: Rolm Bookbinder, MD   PT End of Session - 05/19/22 [phone]     Visit Number 2   #  unchanged due to screen only   PT Start Time 0945    PT Stop Time 4097    PT Time Calculation (min) 10 min    Activity Tolerance Patient tolerated treatment well    Behavior During Therapy [facility] for tasks assessed/performed             Past Medical History:  Diagnosis Date   Allergy    shellfish   Ameloblastoma of jaw    s/p left jaw tumor resection April 2022 with bone graft July 2022 (Dr. Raj Janus)   Breast cancer [facility])    Complication of anesthesia    Coronary atherosclerosis    COVID 03/2019   fever, bodyaches   Fatty liver disease, nonalcoholic    Food allergy    GERD (gastroesophageal reflux disease)    History of radiation therapy    right breast - 05/23/2021-07/01/2021  Dr Gery Pray   Hyperlipidemia    Hypertension    PONV (postoperative nausea and vomiting)    Spondylosis    Past Surgical History:  Procedure Laterality Date   ADENOIDECTOMY     BREAST LUMPECTOMY WITH RADIOACTIVE SEED AND SENTINEL LYMPH NODE BIOPSY Right 04/04/2021   Procedure: RIGHT BREAST LUMPECTOMY WITH RADIOACTIVE SEED AND AXILLARY SENTINEL LYMPH NODE BIOPSY;  Surgeon: Rolm Bookbinder, MD;  Location: [place];  Service: General;  Laterality: Right;   BREAST RECONSTRUCTION Right 04/15/2021   Procedure: RIGHT ONCOPLASTIC BREAST RECONSTRUCTION;  Surgeon: Irene Limbo, MD;  Location: [place];  Service: Plastics;  Laterality: Right;   BREAST REDUCTION SURGERY Left 04/15/2021   Procedure: LEFT BREAST REDUCTION  (BREAST);  Surgeon: Irene Limbo, MD;  Location: [place];  Service: Plastics;  Laterality: Left;   cercalage     COLONOSCOPY     10 yrs ago   MOUTH SURGERY  03/28/2021   TONSILLECTOMY      TYMPANOSTOMY TUBE PLACEMENT     Patient Active Problem List   Diagnosis Date Noted   Genetic testing 03/05/2021   Family history of breast cancer 02/20/2021   Family history of prostate cancer 02/20/2021   Malignant neoplasm of upper-outer quadrant of right breast in female, estrogen receptor positive ([place]) 02/15/2021    REFERRING DIAG: Rt breast cancer  THERAPY DIAG: Aftercare following surgery for neoplasm  PERTINENT HISTORY: Patient was diagnosed on 01/14/2021 with right grade I-II invasive ductal carcinoma breast cancer. It measures 8 mm and has an area od calicifications measuring 1.8 cm in the upper outer quadrant.. It is ER/PR positive and HER2 negative with a Ki67 of 1%. She has a hx of a mandibular surgery due to a benign tumor in 07/2020 and had to have a bone graft in 10/2020. She is missing multiple teeth on the bottom left side.  PRECAUTIONS: lymphedema risk Rt UE  SUBJECTIVE: Pt returns for hre 3 month L-Dex screen.   PAIN:  Are you having pain? No  SOZO SCREENING: Patient was assessed today using the SOZO machine to determine the lymphedema index score. This was compared to her baseline score. It was determined that she is within the recommended range when compared to her baseline and no further action is needed at this time. She will return in  3 months for her next SOZO screen.   L-DEX FLOWSHEETS - 05/19/22 0900       L-DEX LYMPHEDEMA SCREENING   Measurement Type Unilateral    L-DEX MEASUREMENT EXTREMITY Upper Extremity    POSITION  Standing    DOMINANT SIDE Right    At Risk Side Right    BASELINE SCORE (UNILATERAL) -3.9    L-DEX SCORE (UNILATERAL) -2.5    VALUE CHANGE (UNILAT) 1.4              Otelia Limes, PTA 05/19/2022, 9:55 AM

## 2022-05-22 DIAGNOSIS — I10 Essential (primary) hypertension: Secondary | ICD-10-CM | POA: Diagnosis not present

## 2022-05-22 DIAGNOSIS — K219 Gastro-esophageal reflux disease without esophagitis: Secondary | ICD-10-CM | POA: Diagnosis not present

## 2022-05-22 DIAGNOSIS — D509 Iron deficiency anemia, unspecified: Secondary | ICD-10-CM | POA: Diagnosis not present

## 2022-05-22 DIAGNOSIS — R739 Hyperglycemia, unspecified: Secondary | ICD-10-CM | POA: Diagnosis not present

## 2022-05-28 DIAGNOSIS — Z1212 Encounter for screening for malignant neoplasm of rectum: Secondary | ICD-10-CM | POA: Diagnosis not present

## 2022-05-29 DIAGNOSIS — R82998 Other abnormal findings in urine: Secondary | ICD-10-CM | POA: Diagnosis not present

## 2022-05-29 DIAGNOSIS — Z0000 Encounter for general adult medical examination without abnormal findings: Secondary | ICD-10-CM | POA: Diagnosis not present

## 2022-05-29 DIAGNOSIS — I2584 Coronary atherosclerosis due to calcified coronary lesion: Secondary | ICD-10-CM | POA: Diagnosis not present

## 2022-05-29 DIAGNOSIS — Z23 Encounter for immunization: Secondary | ICD-10-CM | POA: Diagnosis not present

## 2022-05-29 DIAGNOSIS — I10 Essential (primary) hypertension: Secondary | ICD-10-CM | POA: Diagnosis not present

## 2022-05-29 DIAGNOSIS — Z1389 Encounter for screening for other disorder: Secondary | ICD-10-CM | POA: Diagnosis not present

## 2022-05-29 DIAGNOSIS — Z1339 Encounter for screening examination for other mental health and behavioral disorders: Secondary | ICD-10-CM | POA: Diagnosis not present

## 2022-05-29 DIAGNOSIS — B353 Tinea pedis: Secondary | ICD-10-CM | POA: Diagnosis not present

## 2022-05-29 DIAGNOSIS — Z170 Estrogen receptor positive status [ER+]: Secondary | ICD-10-CM | POA: Diagnosis not present

## 2022-05-29 DIAGNOSIS — Z1331 Encounter for screening for depression: Secondary | ICD-10-CM | POA: Diagnosis not present

## 2022-05-29 DIAGNOSIS — C50411 Malignant neoplasm of upper-outer quadrant of right female breast: Secondary | ICD-10-CM | POA: Diagnosis not present

## 2022-07-13 NOTE — Progress Notes (Unsigned)
Patient Care Team: Shon Baton, MD as PCP - General (Internal Medicine) Rolm Bookbinder, MD as Consulting Physician (General Surgery) Truitt Merle, MD as Consulting Physician (Hematology) Gery Pray, MD as Consulting Physician (Radiation Oncology)   CHIEF COMPLAINT: Follow up right breast cancer   Oncology History Overview Note   Cancer Staging  Malignant neoplasm of upper-outer quadrant of right breast in female, estrogen receptor positive [facility]) Staging form: Breast, AJCC 8th Edition - Clinical stage from 02/08/2021: Stage IA (cT1b, cN0, cM0, G2, ER+, PR+, HER2-) - Signed by Truitt Merle, MD on 02/19/2021 - Pathologic stage from 04/04/2021: Stage IA (pT1c, pN0, cM0, G1, ER+, PR+, HER2-, Oncotype DX score: 14) - Signed by Truitt Merle, MD on 07/08/2021     Malignant neoplasm of upper-outer quadrant of right breast in female, estrogen receptor positive ([place])  02/02/2021 Mammogram   Right Diagnostic MM and Right Breast US  IMPRESSION: 1.  Suspicious 0.8 cm mass in the right breast at the 12 position. 2. Suspicious calcifications adjacent to the right breast mass spanning 1.1 cm.  No lymphadenopathy seen in right axilla.   02/08/2021 Cancer Staging   Staging form: Breast, AJCC 8th Edition - Clinical stage from 02/08/2021: Stage IA (cT1b, cN0, cM0, G2, ER+, PR+, HER2-) - Signed by Truitt Merle, MD on 02/19/2021 Stage prefix: Initial diagnosis Histologic grading system: 3 grade system   02/08/2021 Pathology Results   Diagnosis 1. Breast, right, needle core biopsy, 12 o'clock, ribbon shaped clip - INVASIVE DUCTAL CARCINOMA - DUCTAL CARCINOMA IN SITU - CALCIFICATIONS - SEE COMMENT 2. Breast, right, needle core biopsy, upper inner quadrant, x shaped clip - FLAT EPITHELIAL ATYPIA WITH CALCIFICATIONS - LOBULAR NEOPLASIA (ATYPICAL LOBULAR HYPERPLASIA) Microscopic Comment 1. Based on the biopsy, the carcinoma appears Nottingham grade 1-2 of 3 and measures 0.6 cm in greatest linear  extent.  1. PROGNOSTIC INDICATORS Results: The tumor cells are EQUIVOCAL for Her2 (2+). Her2 by FISH will be performed and results reported separately. Estrogen Receptor: >95%, POSITIVE, STRONG STAINING INTENSITY Progesterone Receptor: >95%, POSITIVE, STRONG STAINING INTENSITY Proliferation Marker Ki67: 1%  1. FLUORESCENCE IN-SITU HYBRIDIZATION Results: GROUP 5: HER2 **NEGATIVE**   02/15/2021 Initial Diagnosis   Malignant neoplasm of upper-outer quadrant of right breast in female, estrogen receptor positive ([place])   03/04/2021 Genetic Testing   Negative hereditary cancer genetic testing: no pathogenic variants detected in [facility] CustomNext-Cancer +RNAinsight Panel.  The report date is March 04, 2021.   The CustomNext-Cancer+RNAinsight panel offered by [facility] includes sequencing and rearrangement analysis for the following 47 genes:  APC, ATM, AXIN2, BARD1, BMPR1A, BRCA1, BRCA2, BRIP1, CDH1, CDK4, CDKN2A, CHEK2, DICER1, EPCAM, GREM1, HOXB13, MEN1, MLH1, MSH2, MSH3, MSH6, MUTYH, NBN, NF1, NF2, NTHL1, PALB2, PMS2, POLD1, POLE, PTEN, RAD51C, RAD51D, RECQL, RET, SDHA, SDHAF2, SDHB, SDHC, SDHD, SMAD4, SMARCA4, STK11, TP53, TSC1, TSC2, and VHL.  RNA data is routinely analyzed for use in variant interpretation for all genes.   04/04/2021 Cancer Staging   Staging form: Breast, AJCC 8th Edition - Pathologic stage from 04/04/2021: Stage IA (pT1c, pN0, cM0, G1, ER+, PR+, HER2-, Oncotype DX score: 14) - Signed by Truitt Merle, MD on 07/08/2021 Stage prefix: Initial diagnosis Multigene prognostic tests performed: Oncotype DX Recurrence score range: Greater than or equal to 11 Histologic grading system: 3 grade system   04/04/2021 Definitive Surgery   FINAL MICROSCOPIC DIAGNOSIS:   A. BREAST, RIGHT, LUMPECTOMY:  - Invasive ductal carcinoma, 1.1 cm, grade 1  - Ductal carcinoma in situ, low-grade  - Resection margins are  negative for carcinoma - closest is the anterior  margin at 0.8 cm  -  Atypical lobular hyperplasia  - Biopsy site changes  - See oncology table   B. LYMPH NODE, RIGHT AXILLA, SENTINEL, EXCISION:  - Lymph node, negative for carcinoma (0/1)   C. LYMPH NODE, RIGHT AXILLA, SENTINEL, EXCISION:  - Lymph node, negative for carcinoma (0/1)   D. LYMPH NODE, RIGHT AXILLA, SENTINEL, EXCISION:  - Lymph node, negative for carcinoma (0/1)   E. BREAST, RIGHT POSTERIOR MARGIN, EXCISION:  - Atypical lobular hyperplasia    04/04/2021 Oncotype testing   Oncotype DX was obtained on the final surgical sample and the recurrence score of 14 predicts a risk of recurrence outside the breast over the next 9 years of 4%, if the patient's only systemic therapy is an antiestrogen for 5 years.  It also predicts no benefit from chemotherapy.    04/15/2021 Pathology Results   FINAL MICROSCOPIC DIAGNOSIS:   A. BREAST, RIGHT, MAMMOPLASTY:  - Benign mammary parenchyma with fat necrosis and giant cell reaction, 121 grams.  - Negative for atypical proliferative breast disease.   B. BREAST, LEFT, MAMMOPLASTY:  - Benign mammary parenchyma, 175 grams.  - Negative for atypical proliferative breast disease.   05/23/2021 - 07/01/2021 Radiation Therapy   Site Technique Total Dose (Gy) Dose per Fx (Gy) Completed Fx Beam Energies  Breast, Right: Breast_R 3D 50.4/50.4 1.8 28/28 6XFFF, 10XFFF     07/2021 -  Anti-estrogen oral therapy   Exemestane daily      CURRENT THERAPY: Exemestane, starting 07/2021  INTERVAL HISTORY Donna Mcconnell returns for follow up as scheduled. Last seen by 01/08/22. Mammogram 12/2021 showed new calcifications, path revealed fat necrosis with dystrophic calcifications; this was concordant.   ROS   Past Medical History:  Diagnosis Date   Allergy    shellfish   Ameloblastoma of jaw    s/p left jaw tumor resection April 2022 with bone graft July 2022 (Dr. Raj Janus)   Breast cancer [facility])    Complication of anesthesia    Coronary atherosclerosis    COVID 03/2019    fever, bodyaches   Fatty liver disease, nonalcoholic    Food allergy    GERD (gastroesophageal reflux disease)    History of radiation therapy    right breast - 05/23/2021-07/01/2021  Dr Gery Pray   Hyperlipidemia    Hypertension    PONV (postoperative nausea and vomiting)    Spondylosis      Past Surgical History:  Procedure Laterality Date   ADENOIDECTOMY     BREAST LUMPECTOMY WITH RADIOACTIVE SEED AND SENTINEL LYMPH NODE BIOPSY Right 04/04/2021   Procedure: RIGHT BREAST LUMPECTOMY WITH RADIOACTIVE SEED AND AXILLARY SENTINEL LYMPH NODE BIOPSY;  Surgeon: Rolm Bookbinder, MD;  Location: [facility];  Service: General;  Laterality: Right;   BREAST RECONSTRUCTION Right 04/15/2021   Procedure: RIGHT ONCOPLASTIC BREAST RECONSTRUCTION;  Surgeon: Irene Limbo, MD;  Location: [facility];  Service: Plastics;  Laterality: Right;   BREAST REDUCTION SURGERY Left 04/15/2021   Procedure: LEFT BREAST REDUCTION  (BREAST);  Surgeon: Irene Limbo, MD;  Location: [facility];  Service: Plastics;  Laterality: Left;   cercalage     COLONOSCOPY     10 yrs ago   MOUTH SURGERY  03/28/2021   TONSILLECTOMY     TYMPANOSTOMY TUBE PLACEMENT       Outpatient Encounter Medications as of 07/14/2022  Medication Sig   ASTAXANTHIN PO Take 16 mg by mouth daily.   CALCIUM-VITAMIN D PO Take  1 tablet by mouth daily.   chlorhexidine (PERIDEX) 0.12 % solution 15 mLs by Mouth Rinse route 2 (two) times daily.   EPINEPHrine (EPIPEN 2-PAK) 0.3 mg/0.3 mL IJ SOAJ injection Inject 0.3 mg into the muscle once for 1 dose.   exemestane (AROMASIN) 25 MG tablet Take 1 tablet (25 mg total) by mouth daily after breakfast.   pantoprazole (PROTONIX) 40 MG tablet Take 40 mg by mouth daily.   valsartan (DIOVAN) 80 MG tablet Take 80 mg by mouth daily.   No facility-administered encounter medications on file as of 07/14/2022.     There were no vitals filed for this visit. There is no height or weight on file to calculate BMI.   PHYSICAL  EXAM GENERAL:alert, no distress and comfortable SKIN: no rash  EYES: sclera clear NECK: without mass LYMPH:  no palpable cervical or supraclavicular lymphadenopathy  LUNGS: clear with normal breathing effort HEART: regular rate & rhythm, no lower extremity edema ABDOMEN: abdomen soft, non-tender and normal bowel sounds NEURO: alert & oriented x 3 with fluent speech, no focal motor/sensory deficits Breast exam:  PAC without erythema    CBC    Component Value Date/Time   WBC 6.1 01/08/2022 1114   RBC 4.42 01/08/2022 1114   HGB 12.7 01/08/2022 1114   HGB 13.7 02/20/2021 1225   HCT 38.4 01/08/2022 1114   PLT 318 01/08/2022 1114   PLT 289 02/20/2021 1225   MCV 86.9 01/08/2022 1114   MCH 28.7 01/08/2022 1114   MCHC 33.1 01/08/2022 1114   RDW 15.1 01/08/2022 1114   LYMPHSABS 1.2 01/08/2022 1114   MONOABS 0.5 01/08/2022 1114   EOSABS 0.1 01/08/2022 1114   BASOSABS 0.0 01/08/2022 1114     CMP     Component Value Date/Time   NA 137 01/08/2022 1132   K 4.0 01/08/2022 1132   CL 104 01/08/2022 1132   CO2 27 01/08/2022 1132   GLUCOSE 120 (H) 01/08/2022 1132   BUN 18 01/08/2022 1132   CREATININE 0.76 01/08/2022 1132   CREATININE 0.80 02/20/2021 1225   CALCIUM 10.1 01/08/2022 1132   PROT 7.0 01/08/2022 1132   ALBUMIN 4.4 01/08/2022 1132   AST 22 01/08/2022 1132   AST 33 02/20/2021 1225   ALT 11 01/08/2022 1132   ALT 21 02/20/2021 1225   ALKPHOS 112 01/08/2022 1132   BILITOT 1.0 01/08/2022 1132   BILITOT 1.0 02/20/2021 1225   GFRNONAA >60 01/08/2022 1132   GFRNONAA >60 02/20/2021 1225     ASSESSMENT & PLAN:Donna Mcconnell is a 62 y.o. post-menopausal female with    1. Malignant neoplasm of upper-outer quadrant of right breast, IDC and DCIS, Stage IA, p(T1b, N0), ER+/PR+/HER2-, Grade 2  -Diagnosed 02/2021, S/p right lumpectomy on 04/04/21, path showed IDC and DCIS, as well as ALH. -Oncotype RS of 14, low risk. -s/p radiation therapy under Dr. Sondra Come 1/26-07/01/21. -she  started exemestane in early 07/2021. She is tolerating well with manageable hot flashes and joint stiffness in her hands.   2. Bone Health  -DEXA on 03/14/21 was normal, with lowest T-score of -0.7 at left radius. -plan to repeat every 2 years on exemestane    PLAN:  No orders of the defined types were placed in this encounter.     All questions were answered. The patient knows to call the clinic with any problems, questions or concerns. No barriers to learning were detected. I spent *** counseling the patient face to face. The total time spent in the appointment  was *** and more than 50% was on counseling, review of test results, and coordination of care.   Cira Rue, NP-C @DATE @

## 2022-07-14 ENCOUNTER — Encounter: Payer: Self-pay | Admitting: Nurse Practitioner

## 2022-07-14 ENCOUNTER — Inpatient Hospital Stay: Payer: BC Managed Care – PPO | Attending: Nurse Practitioner

## 2022-07-14 ENCOUNTER — Inpatient Hospital Stay: Payer: BC Managed Care – PPO | Admitting: Nurse Practitioner

## 2022-07-14 VITALS — BP 147/85 | HR 78 | Temp 98.4°F | Resp 17 | Wt 193.2 lb

## 2022-07-14 DIAGNOSIS — Z170 Estrogen receptor positive status [ER+]: Secondary | ICD-10-CM | POA: Diagnosis not present

## 2022-07-14 DIAGNOSIS — Z923 Personal history of irradiation: Secondary | ICD-10-CM | POA: Diagnosis not present

## 2022-07-14 DIAGNOSIS — C50411 Malignant neoplasm of upper-outer quadrant of right female breast: Secondary | ICD-10-CM | POA: Diagnosis not present

## 2022-07-14 DIAGNOSIS — Z79811 Long term (current) use of aromatase inhibitors: Secondary | ICD-10-CM | POA: Insufficient documentation

## 2022-07-14 LAB — COMPREHENSIVE METABOLIC PANEL
ALT: 15 U/L (ref 0–44)
AST: 23 U/L (ref 15–41)
Albumin: 4.3 g/dL (ref 3.5–5.0)
Alkaline Phosphatase: 121 U/L (ref 38–126)
Anion gap: 7 (ref 5–15)
BUN: 20 mg/dL (ref 8–23)
CO2: 27 mmol/L (ref 22–32)
Calcium: 9.6 mg/dL (ref 8.9–10.3)
Chloride: 106 mmol/L (ref 98–111)
Creatinine, Ser: 0.77 mg/dL (ref 0.44–1.00)
GFR, Estimated: >60 mL/min (ref >60)
Glucose, Bld: 108 mg/dL — ABNORMAL HIGH (ref 70–99)
Potassium: 3.9 mmol/L (ref 3.5–5.1)
Sodium: 140 mmol/L (ref 135–145)
Total Bilirubin: 0.8 mg/dL (ref 0.3–1.2)
Total Protein: 7.1 g/dL (ref 6.5–8.1)

## 2022-07-14 LAB — CBC WITH DIFFERENTIAL/PLATELET
Abs Immature Granulocytes: 0 10*3/uL (ref 0.00–0.07)
Basophils Absolute: 0 10*3/uL (ref 0.0–0.1)
Basophils Relative: 1 %
Eosinophils Absolute: 0.2 10*3/uL (ref 0.0–0.5)
Eosinophils Relative: 4 %
HCT: 40.8 % (ref 36.0–46.0)
Hemoglobin: 13.4 g/dL (ref 12.0–15.0)
Immature Granulocytes: 0 %
Lymphocytes Relative: 28 %
Lymphs Abs: 1.3 10*3/uL (ref 0.7–4.0)
MCH: 29.5 pg (ref 26.0–34.0)
MCHC: 32.8 g/dL (ref 30.0–36.0)
MCV: 89.9 fL (ref 80.0–100.0)
Monocytes Absolute: 0.4 10*3/uL (ref 0.1–1.0)
Monocytes Relative: 10 %
Neutro Abs: 2.6 10*3/uL (ref 1.7–7.7)
Neutrophils Relative %: 57 %
Platelets: 312 10*3/uL (ref 150–400)
RBC: 4.54 MIL/uL (ref 3.87–5.11)
RDW: 14.3 % (ref 11.5–15.5)
WBC: 4.5 10*3/uL (ref 4.0–10.5)
nRBC: 0 % (ref 0.0–0.2)

## 2022-07-14 MED ORDER — EXEMESTANE 25 MG PO TABS: 25.0000 mg | ORAL_TABLET | Freq: Every day | ORAL | 3 refills | Status: DC

## 2022-07-16 ENCOUNTER — Encounter: Payer: Self-pay | Admitting: Allergy

## 2022-07-16 ENCOUNTER — Ambulatory Visit: Payer: BC Managed Care – PPO | Admitting: Allergy

## 2022-07-16 VITALS — BP 138/86 | HR 70 | Temp 98.4°F | Resp 18 | Ht 62.0 in | Wt 195.0 lb

## 2022-07-16 DIAGNOSIS — T7800XA Anaphylactic reaction due to unspecified food, initial encounter: Secondary | ICD-10-CM | POA: Diagnosis not present

## 2022-07-16 DIAGNOSIS — L309 Dermatitis, unspecified: Secondary | ICD-10-CM

## 2022-07-16 DIAGNOSIS — J310 Chronic rhinitis: Secondary | ICD-10-CM

## 2022-07-16 MED ORDER — OPZELURA 1.5 % EX CREA: 1.0000 | TOPICAL_CREAM | Freq: Two times a day (BID) | CUTANEOUS | 5 refills | Status: DC | PRN

## 2022-07-16 MED ORDER — PIMECROLIMUS 1 % EX CREA: TOPICAL_CREAM | Freq: Two times a day (BID) | CUTANEOUS | 5 refills | Status: DC

## 2022-07-16 MED ORDER — EPINEPHRINE 0.3 MG/0.3ML IJ SOAJ: 0.3000 mg | INTRAMUSCULAR | 1 refills | Status: AC | PRN

## 2022-07-16 NOTE — Progress Notes (Signed)
Follow-up Note  RE: Donna Mcconnell MRN: [id number] DOB: [birth year] Date of Office Visit: 07/16/2022   History of present illness: Donna Mcconnell is a 62 y.o. female presenting today for follow-up of food allergy and rhinitis.  She was last seen in the office on 07/26/21 by myself.   She states she is getting her dental graft next week. She has been avoiding shellfish in the diet and has access to an epinephrine device that she has not needed to use.  She states she did not realize that one of her supplements Astaxanthin ingredients does contain shrimp derivative.  When she realized that she did stop using this supplement.  She does not report at she would have symptoms with taking the supplement however.  She is interested in trying another shrimp challenge in the future when she does get her dental procedures done. She states her allergy symptoms are not really bothering her at this time.  She still does have Xyzal at home that she did use last year when she was having symptoms. She states over the past couple of weeks she has noted a rash that has been around her eye.  She states it started off like a bump on the lower lid that she thought was may be a stye that she did do compresses and it did seem to help with this she started getting red little bumps more on the upper cheek area near the eye.  This she started noticing similar symptoms on the opposite eye.  It is not itchy or bothersome.  She states she does not notice there when she looks in the mirror.  She has not changed anything in her diet or facial products.  Does not wear any make-up or mascara.  She has not changed any jewelry.  She does not really do any nail polish.  For this rash she did try use of Xyzal but this did not seem to help and she tried Benadryl which also did not seem to help.  She is interested in a dermatology referral.   Review of systems: Review of Systems  Constitutional: Negative.   HENT: Negative.    Eyes:  Negative.   Respiratory: Negative.    Cardiovascular: Negative.   Gastrointestinal: Negative.   Musculoskeletal: Negative.   Skin:        See HPI  Allergic/Immunologic: Negative.   Neurological: Negative.      All other systems negative unless noted above in HPI  Past medical/social/surgical/family history have been reviewed and are unchanged unless specifically indicated below.  No changes  Medication List: Current Outpatient Medications  Medication Sig Dispense Refill   CALCIUM-VITAMIN D PO Take 1 tablet by mouth daily.     exemestane (AROMASIN) 25 MG tablet Take 1 tablet (25 mg total) by mouth daily after breakfast. 90 tablet 3   pantoprazole (PROTONIX) 40 MG tablet Take 40 mg by mouth daily.     valsartan (DIOVAN) 80 MG tablet Take 80 mg by mouth daily.     EPINEPHrine (EPIPEN 2-PAK) 0.3 mg/0.3 mL IJ SOAJ injection Inject 0.3 mg into the muscle once for 1 dose. 0.3 mL 1   No current facility-administered medications for this visit.     Known medication allergies: Allergies  Allergen Reactions   Shellfish Allergy     Mussels - Nausea and Vomiting Other Shellfish - welts      Physical examination: Blood pressure 138/86, pulse 70, temperature 98.4 F (36.9 C), temperature source Temporal, resp.  rate 18, height 5\' 2"  (1.575 m), weight 195 lb (88.5 kg), SpO2 99 %.  General: Alert, interactive, in no acute distress. HEENT: PERRLA, TMs pearly gray, turbinates non-edematous without discharge, post-pharynx non erythematous. Neck: Supple without lymphadenopathy. Lungs: Clear to auscultation without wheezing, rhonchi or rales. {no increased work of breathing. CV: Normal S1, S2 without murmurs. Abdomen: Nondistended, nontender. Skin: Erythematous papules around skin of eyelid b/l with a fluid filled papule on left lower lid . Extremities:  No clubbing, cyanosis or edema. Neuro:   Grossly intact.  Diagnositics/Labs: None today  Assessment and plan: Rash around eyes  -  will place dermatology referral for Dr Shanon Brow  - can use non-steroid ointment twice a day as needed for rash around eye.  Avoid getting into eye.  Can use on eyelid.    Will send in [place] and [place].   Opzelura may not be covered and if not then would use the Elidel.    - continue to keep face moisturized and would continue to avoid make-up around the eyes  Anaphylaxis due to food  - continue avoidance of shellfish   - have access to self-injectable epinephrine (Epipen) 0.3mg  at all times  - follow emergency action plan in case of allergic reaction  - we can attempt another shrimp challenge when ready this year  Seasonal Allergies - at this time not having any symptoms - can take Xyzal 1 tab daily as needed  Follow-up: 1 year or sooner if needed  I appreciate the opportunity to take part in [facility] care. Please do not hesitate to contact me with questions.  Sincerely,   Prudy Feeler, MD Allergy/Immunology [facility] [place]

## 2022-07-16 NOTE — Patient Instructions (Addendum)
Rash around eyes  - will place dermatology referral for Dr Shanon Brow  - can use non-steroid ointment twice a day as needed for rash around eye.  Avoid getting into eye.  Can use on eyelid.    Will send in [place] and [place].   Opzelura may not be covered and if not then would use the Elidel.    - continue to keep face moisturized and would continue to avoid make-up around the eyes  Anaphylaxis due to food  - continue avoidance of shellfish   - have access to self-injectable epinephrine (Epipen) 0.3mg  at all times  - follow emergency action plan in case of allergic reaction  - we can attempt another shrimp challenge when ready this year  Seasonal Allergies - at this time not having any symptoms - can take Xyzal 1 tab daily as needed  Follow-up: 1 year or sooner if needed

## 2022-07-17 ENCOUNTER — Telehealth: Payer: Self-pay

## 2022-07-17 NOTE — Telephone Encounter (Signed)
PA request received via CMM for Opzelura 1.5% cream  PA has been submitted to [facility] and is pending determination  Key: [id number]

## 2022-07-21 NOTE — Telephone Encounter (Signed)
PA has been DENIED due to:   Denied. The request for coverage of Opzelura is denied. This medication is not on the formulary and is approved to treat mild to moderate atopic dermatitis (eczema; a red, itchy rash) when the member has tried a phosphodiesterase-4 (PDE4) inhibitor (such as Eucrisa) and it did not work, or the member cannot use ALL phosphodiesterase-4 (PDE4) inhibitors. In this case, the member has not tried a phosphodiesterase-4 (PDE4) inhibitor (such as [place]).

## 2022-07-22 NOTE — Telephone Encounter (Signed)
I do see where Elidel was sent in, I don't think we have received any kickback on that medication, so hopefully everything should be fine.

## 2022-08-18 ENCOUNTER — Ambulatory Visit: Payer: BC Managed Care – PPO

## 2022-08-21 DIAGNOSIS — I10 Essential (primary) hypertension: Secondary | ICD-10-CM | POA: Diagnosis not present

## 2022-08-21 DIAGNOSIS — H019 Unspecified inflammation of eyelid: Secondary | ICD-10-CM | POA: Diagnosis not present

## 2022-08-21 DIAGNOSIS — H00024 Hordeolum internum left upper eyelid: Secondary | ICD-10-CM | POA: Diagnosis not present

## 2022-09-03 DIAGNOSIS — H01001 Unspecified blepharitis right upper eyelid: Secondary | ICD-10-CM | POA: Diagnosis not present

## 2022-09-03 DIAGNOSIS — H01004 Unspecified blepharitis left upper eyelid: Secondary | ICD-10-CM | POA: Diagnosis not present

## 2022-09-03 DIAGNOSIS — H0014 Chalazion left upper eyelid: Secondary | ICD-10-CM | POA: Diagnosis not present

## 2022-09-11 ENCOUNTER — Ambulatory Visit: Payer: BC Managed Care – PPO | Admitting: Dermatology

## 2022-09-11 ENCOUNTER — Encounter: Payer: Self-pay | Admitting: Dermatology

## 2022-09-11 VITALS — BP 142/92

## 2022-09-11 DIAGNOSIS — L853 Xerosis cutis: Secondary | ICD-10-CM

## 2022-09-11 DIAGNOSIS — L259 Unspecified contact dermatitis, unspecified cause: Secondary | ICD-10-CM | POA: Diagnosis not present

## 2022-09-11 DIAGNOSIS — L232 Allergic contact dermatitis due to cosmetics: Secondary | ICD-10-CM

## 2022-09-11 MED ORDER — OPZELURA 1.5 % EX CREA: 1.0000 | TOPICAL_CREAM | Freq: Two times a day (BID) | CUTANEOUS | 0 refills | Status: AC

## 2022-09-11 NOTE — Progress Notes (Signed)
   New Patient Visit   Subjective  Donna Mcconnell is a 62 y.o. female who presents for the following: irritation on eyelids  She sees Dr. Margaretmary Bayley (Allergist) for eye blisters/possible white heads x 2 months. They were not itchy or painful. They come and go. She is seeing an Ophthalmologist for a Chalazion cyst on the left upper eyelid.  The allergist prescribed Opzelura 1.5% and Pimecrolimus which insurance would not cover so she did not use.  The following portions of the chart were reviewed this encounter and updated as appropriate: medications, allergies, medical history  Review of Systems:  No other skin or systemic complaints except as noted in HPI or Assessment and Plan.  Objective  Well appearing patient in no apparent distress; mood and affect are within normal limits.  A focused examination was performed of the following areas: eyelids  Relevant exam findings are noted in the Assessment and Plan.    Assessment & Plan   Contact Dermatits (Possible due to cosmetics) Exam: pink scaly plaques on b/l eyelids  Treatment Plan: Will send Opzelura to [facility].  Pimecrolimus will be sent if she can not get this.  Utilizing a specialty pharmacy will improve chances of pt accessing opzelura and they have their own generic of pimecrolimus that she will definitely be able to purchase.  Xerosis Exam: Dry skin on face.  Treatment Plan: Daily skin care regimen with very gentle skin  care since she's currently being treated for eyelid dermatitis  Vani cream products recommended and discussed. Images of Vit C serum, moisturizer, SPF, and cleanser     Return in about 4 weeks (around 10/09/2022) for TBSE, contact derm.  Jaclynn Guarneri, CMA, am acting as scribe for [employer], DO.   Documentation: I have reviewed the above documentation for accuracy and completeness, and I agree with the above.  Langston Reusing, DO

## 2022-09-11 NOTE — Patient Instructions (Signed)
Your prescription was sent to [facility] in [place]. A representative from [employer] will contact you within 2 business hours to verify your address and insurance information to schedule a free delivery. If for any reason you do not receive a phone call from them, please reach out to them. Their phone number is [phone] and their hours are Monday-Friday 9:00 am-5:00 pm.      Due to recent changes in healthcare laws, you may see results of your pathology and/or laboratory studies on MyChart before the doctors have had a chance to review them. We understand that in some cases there may be results that are confusing or concerning to you. Please understand that not all results are received at the same time and often the doctors may need to interpret multiple results in order to provide you with the best plan of care or course of treatment. Therefore, we ask that you please give [place] 2 business days to thoroughly review all your results before contacting the office for clarification. Should we see a critical lab result, you will be contacted sooner.   If You Need Anything After Your Visit  If you have any questions or concerns for your doctor, please call our main line at [phone] If no one answers, please leave a voicemail as directed and we will return your call as soon as possible. Messages left after 4 pm will be answered the following business day.   You may also send [place] a message via MyChart. We typically respond to MyChart messages within 1-2 business days.  For prescription refills, please ask your pharmacy to contact our office. Our fax number is [phone].  If you have an urgent issue when the clinic is closed that cannot wait until the next business day, you can page your doctor at the number below.    Please note that while we do our best to be available for urgent issues outside of office hours, we are not available 24/7.   If you have an urgent  issue and are unable to reach [place], you may choose to seek medical care at your doctor's office, retail clinic, urgent care center, or emergency room.  If you have a medical emergency, please immediately call 911 or go to the emergency department. In the event of inclement weather, please call our main line at [phone] for an update on the status of any delays or closures.  Dermatology Medication Tips: Please keep the boxes that topical medications come in in order to help keep track of the instructions about where and how to use these. Pharmacies typically print the medication instructions only on the boxes and not directly on the medication tubes.   If your medication is too expensive, please contact our office at [phone] or send [place] a message through MyChart.   We are unable to tell what your co-pay for medications will be in advance as this is different depending on your insurance coverage. However, we may be able to find a substitute medication at lower cost or fill out paperwork to get insurance to cover a needed medication.   If a prior authorization is required to get your medication covered by your insurance company, please allow [place] 1-2 business days to complete this process.   Additional Tips:  Sun Protection: During the daytime, it is essential to apply a broad-spectrum sunscreen with an SPF of 30 or higher to protect your skin from harmful UV  rays. Apply sunscreen as the last step of your morning skincare routine and reapply throughout the day as needed, especially if you will be spending time outdoors.  Hydration: Drink plenty of water throughout the day to keep your skin hydrated from within.  Healthy Lifestyle: Maintain a balanced diet, get regular exercise, manage stress, and prioritize adequate sleep to support overall skin health.   Following a consistent daily skincare regimen can help promote healthy, radiant skin and minimize the risk of common skin issues. Be  patient and consistent with your routine, and remember to listen to your skin's needs

## 2022-09-12 DIAGNOSIS — H0014 Chalazion left upper eyelid: Secondary | ICD-10-CM | POA: Diagnosis not present

## 2022-09-15 ENCOUNTER — Ambulatory Visit: Payer: BC Managed Care – PPO | Attending: General Surgery

## 2022-09-15 VITALS — Wt 194.0 lb

## 2022-09-15 DIAGNOSIS — Z483 Aftercare following surgery for neoplasm: Secondary | ICD-10-CM | POA: Insufficient documentation

## 2022-09-15 NOTE — Therapy (Signed)
OUTPATIENT PHYSICAL THERAPY SOZO SCREENING NOTE   Patient Name: Donna Mcconnell MRN: [id number] DOB:08/05/1960, 62 y.o., female Today's Date: 09/15/2022  PCP: Creola Corn, MD REFERRING PROVIDER: Emelia Loron, MD   PT End of Session - 09/15/22 [phone]     Visit Number 2   # unchangede due to screen only   PT Start Time 0931    PT Stop Time 0935    PT Time Calculation (min) 4 min    Activity Tolerance Patient tolerated treatment well    Behavior During Therapy [facility] for tasks assessed/performed             Past Medical History:  Diagnosis Date   Allergy    shellfish   Ameloblastoma of jaw    s/p left jaw tumor resection April 2022 with bone graft July 2022 (Dr. Leonie Man)   Breast cancer [facility])    Complication of anesthesia    Coronary atherosclerosis    COVID 03/2019   fever, bodyaches   Fatty liver disease, nonalcoholic    Food allergy    GERD (gastroesophageal reflux disease)    History of radiation therapy    right breast - 05/23/2021-07/01/2021  Dr Antony Blackbird   Hyperlipidemia    Hypertension    PONV (postoperative nausea and vomiting)    Spondylosis    Past Surgical History:  Procedure Laterality Date   ADENOIDECTOMY     BREAST LUMPECTOMY WITH RADIOACTIVE SEED AND SENTINEL LYMPH NODE BIOPSY Right 04/04/2021   Procedure: RIGHT BREAST LUMPECTOMY WITH RADIOACTIVE SEED AND AXILLARY SENTINEL LYMPH NODE BIOPSY;  Surgeon: Emelia Loron, MD;  Location: [facility] OR;  Service: General;  Laterality: Right;   BREAST RECONSTRUCTION Right 04/15/2021   Procedure: RIGHT ONCOPLASTIC BREAST RECONSTRUCTION;  Surgeon: Glenna Fellows, MD;  Location: [facility] OR;  Service: Plastics;  Laterality: Right;   BREAST REDUCTION SURGERY Left 04/15/2021   Procedure: LEFT BREAST REDUCTION  (BREAST);  Surgeon: Glenna Fellows, MD;  Location: [facility] OR;  Service: Plastics;  Laterality: Left;   cercalage     COLONOSCOPY     10 yrs ago   MOUTH SURGERY  03/28/2021   TONSILLECTOMY     TYMPANOSTOMY  TUBE PLACEMENT     Patient Active Problem List   Diagnosis Date Noted   Genetic testing 03/05/2021   Family history of breast cancer 02/20/2021   Family history of prostate cancer 02/20/2021   Malignant neoplasm of upper-outer quadrant of right breast in female, estrogen receptor positive (HCC) 02/15/2021    REFERRING DIAG: Rt breast cancer  THERAPY DIAG: Aftercare following surgery for neoplasm  PERTINENT HISTORY: Patient was diagnosed on 01/14/2021 with right grade I-II invasive ductal carcinoma breast cancer. It measures 8 mm and has an area od calicifications measuring 1.8 cm in the upper outer quadrant.. It is ER/PR positive and HER2 negative with a Ki67 of 1%. She has a hx of a mandibular surgery due to a benign tumor in 07/2020 and had to have a bone graft in 10/2020. She is missing multiple teeth on the bottom left side.  PRECAUTIONS: lymphedema risk Rt UE  SUBJECTIVE: Pt returns for her 3 month L-Dex screen.   PAIN:  Are you having pain? No  SOZO SCREENING: Patient was assessed today using the SOZO machine to determine the lymphedema index score. This was compared to her baseline score. It was determined that she is within the recommended range when compared to her baseline and no further action is needed at this time. She will return in 3  months for her next SOZO screen.   L-DEX FLOWSHEETS - 09/15/22 0900       L-DEX LYMPHEDEMA SCREENING   Measurement Type Unilateral    L-DEX MEASUREMENT EXTREMITY Upper Extremity    POSITION  Standing    DOMINANT SIDE Right    At Risk Side Right    BASELINE SCORE (UNILATERAL) -3.9    L-DEX SCORE (UNILATERAL) -4.6    VALUE CHANGE (UNILAT) -0.7              Hermenia Bers, PTA 09/15/2022, 9:36 AM

## 2022-10-09 ENCOUNTER — Encounter: Payer: Self-pay | Admitting: Dermatology

## 2022-10-09 ENCOUNTER — Ambulatory Visit: Payer: BC Managed Care – PPO | Admitting: Dermatology

## 2022-10-09 VITALS — BP 157/98

## 2022-10-09 DIAGNOSIS — Z8619 Personal history of other infectious and parasitic diseases: Secondary | ICD-10-CM

## 2022-10-09 DIAGNOSIS — D225 Melanocytic nevi of trunk: Secondary | ICD-10-CM | POA: Diagnosis not present

## 2022-10-09 DIAGNOSIS — L853 Xerosis cutis: Secondary | ICD-10-CM | POA: Diagnosis not present

## 2022-10-09 DIAGNOSIS — W908XXA Exposure to other nonionizing radiation, initial encounter: Secondary | ICD-10-CM

## 2022-10-09 DIAGNOSIS — Z1283 Encounter for screening for malignant neoplasm of skin: Secondary | ICD-10-CM | POA: Diagnosis not present

## 2022-10-09 DIAGNOSIS — D239 Other benign neoplasm of skin, unspecified: Secondary | ICD-10-CM

## 2022-10-09 DIAGNOSIS — D1801 Hemangioma of skin and subcutaneous tissue: Secondary | ICD-10-CM

## 2022-10-09 DIAGNOSIS — L578 Other skin changes due to chronic exposure to nonionizing radiation: Secondary | ICD-10-CM

## 2022-10-09 DIAGNOSIS — L814 Other melanin hyperpigmentation: Secondary | ICD-10-CM | POA: Diagnosis not present

## 2022-10-09 DIAGNOSIS — L821 Other seborrheic keratosis: Secondary | ICD-10-CM

## 2022-10-09 DIAGNOSIS — D229 Melanocytic nevi, unspecified: Secondary | ICD-10-CM

## 2022-10-09 DIAGNOSIS — L708 Other acne: Secondary | ICD-10-CM

## 2022-10-09 DIAGNOSIS — L700 Acne vulgaris: Secondary | ICD-10-CM

## 2022-10-09 DIAGNOSIS — D492 Neoplasm of unspecified behavior of bone, soft tissue, and skin: Secondary | ICD-10-CM

## 2022-10-09 HISTORY — DX: Other benign neoplasm of skin, unspecified: D23.9

## 2022-10-09 MED ORDER — PENCICLOVIR 1 % EX CREA: 1.0000 | TOPICAL_CREAM | CUTANEOUS | 3 refills | Status: DC

## 2022-10-09 NOTE — Progress Notes (Signed)
Follow up Visit Subjective  Donna Mcconnell is a 62 y.o. female who presents for the following: irritation on eyelids  She sees Dr. Margaretmary Bayley (Allergist) for eye blisters/possible white heads x 2 months. They were not itchy or painful. They come and go. She saw the Ophthalmologist on May 17th for a Chalazion cyst on the left upper eyelid. He lanced it and it is much better. She was able to get Opzelura 1.5% from [facility]. She is very happy with the Vani cream. She says it is much improved. She is asking about what moisturizer will be good for daily use on the eyelids.   The following portions of the chart were reviewed this encounter and updated as appropriate: medications, allergies, medical history  Review of Systems:  No other skin or systemic complaints except as noted in HPI or Assessment and Plan.  She is also here for a full skin exam. She has no personal or family history of skin cancer. Her last skin exam was over 5 years ago.   Objective  Well appearing patient in no apparent distress; mood and affect are within normal limits.  A focused examination was performed of the following areas: eyelids  Relevant exam findings are noted in the Assessment and Plan.  Left Upper Back 8mm irregular brown macule       Assessment & Plan   Contact Dermatits, Resolved (Possible due to cosmetics) Exam: pink scaly plaques on b/l eyelids  Treatment Plan:  Xerosis Exam: Dry skin on face.  Treatment Plan: Daily skin care regimen with very gentle skin  care since she's currently being treated for eyelid dermatitis. Advised Vani cream ointment  Vani cream products recommended and discussed. Images of Vit C serum, moisturizer, SPF, and cleanser  LENTIGINES, SEBORRHEIC KERATOSES, HEMANGIOMAS - Benign normal skin lesions - Benign-appearing - Call for any changes  MELANOCYTIC NEVI - Tan-brown and/or pink-flesh-colored symmetric macules and papules - Benign appearing on exam  today - Observation - Call clinic for new or changing moles - Recommend daily use of broad spectrum spf 30+ sunscreen to sun-exposed areas.   ACTINIC DAMAGE - Chronic condition, secondary to cumulative UV/sun exposure - diffuse scaly erythematous macules with underlying dyspigmentation - Recommend daily broad spectrum sunscreen SPF 30+ to sun-exposed areas, reapply every 2 hours as needed.  - Staying in the shade or wearing long sleeves, sun glasses (UVA+UVB protection) and wide brim hats (4-inch brim around the entire circumference of the hat) are also recommended for sun protection.  - Call for new or changing lesions.  Comedonal Acne Exam: tiny whiteheads on cheeks and periocular areas  Treatment plan: LaRoche Posay Effaclar on Mon, Wed, and Fri nightly Sun protection discussed  HISTORY OF HERPES VIRAL INFECTION (COLD SORES) Exam Clear today  Herpes Simplex Virus = Cold Sores = Fever Blisters is a chronic recurring blistering; scabbing sore-producing viral infection that is recurrent usually in the same area triggered by stress, sun/UV exposure and trauma.  It is infectious and can be spread from person to person by direct contact.  It is not curable, but is treatable with topical and oral medication.  Treatment Plan:  Denavir ointment daily as needed. Sent to [facility]  SKIN CANCER SCREENING PERFORMED TODAY    Neoplasm of skin Left Upper Back  Skin / nail biopsy Type of biopsy: tangential   Informed consent: discussed and consent obtained   Timeout: patient name, date of birth, surgical site, and procedure verified   Procedure prep:  Patient was  prepped and draped in usual sterile fashion Prep type:  Isopropyl alcohol Anesthesia: the lesion was anesthetized in a standard fashion   Anesthetic:  1% lidocaine w/ epinephrine 1-100,000 buffered w/ 8.4% NaHCO3 Instrument used: DermaBlade   Hemostasis achieved with: aluminum chloride   Outcome: patient tolerated procedure well    Post-procedure details: sterile dressing applied and wound care instructions given   Dressing type: petrolatum gauze and bandage     Return in about 1 year (around 10/09/2023) for TBSE.  Jaclynn Guarneri, CMA, am acting as scribe for [employer], DO.   Documentation: I have reviewed the above documentation for accuracy and completeness, and I agree with the above.  Langston Reusing, DO

## 2022-10-09 NOTE — Patient Instructions (Addendum)
Patient Handout: Wound Care for Skin Biopsy Site  Patient Handout: Wound Care for Skin Biopsy Site  Taking Care of Your Skin Biopsy Site  Proper care of the biopsy site is essential for promoting healing and minimizing scarring. This handout provides instructions on how to care for your biopsy site to ensure optimal recovery.  1. Cleaning the Wound:  Clean the biopsy site daily with gentle soap and water. Gently pat the area dry with a clean, soft towel. Avoid harsh scrubbing or rubbing the area, as this can irritate the skin and delay healing.  2. Applying Aquaphor and Bandage:  After cleaning the wound, apply a thin layer of Aquaphor ointment to the biopsy site. Cover the area with a sterile bandage to protect it from dirt, bacteria, and friction. Change the bandage daily or as needed if it becomes soiled or wet.  3. Continued Care for One Week:  Repeat the cleaning, Aquaphor application, and bandaging process daily for one week following the biopsy procedure. Keeping the wound clean and moist during this initial healing period will help prevent infection and promote optimal healing.  4. Massaging Aquaphor into the Area:  ---After one week, discontinue the use of bandages but continue to apply Aquaphor to the biopsy site. ----Gently massage the Aquaphor into the area using circular motions. ---Massaging the skin helps to promote circulation and prevent the formation of scar tissue.   Additional Tips:  Avoid exposing the biopsy site to direct sunlight during the healing process, as this can cause hyperpigmentation or worsen scarring. If you experience any signs of infection, such as increased redness, swelling, warmth, or drainage from the wound, contact your healthcare provider immediately. Follow any additional instructions provided by your healthcare provider for caring for the biopsy site and managing any discomfort. Conclusion:  Taking proper care of your skin biopsy site  is crucial for ensuring optimal healing and minimizing scarring. By following these instructions for cleaning, applying Aquaphor, and massaging the area, you can promote a smooth and successful recovery. If you have any questions or concerns about caring for your biopsy site, don't hesitate to contact your healthcare provider for guidance.    Due to recent changes in healthcare laws, you may see results of your pathology and/or laboratory studies on MyChart before the doctors have had a chance to review them. We understand that in some cases there may be results that are confusing or concerning to you. Please understand that not all results are received at the same time and often the doctors may need to interpret multiple results in order to provide you with the best plan of care or course of treatment. Therefore, we ask that you please give us 2 business days to thoroughly review all your results before contacting the office for clarification. Should we see a critical lab result, you will be contacted sooner.   If You Need Anything After Your Visit  If you have any questions or concerns for your doctor, please call our main line at [phone] If no one answers, please leave a voicemail as directed and we will return your call as soon as possible. Messages left after 4 pm will be answered the following business day.   You may also send us a message via MyChart. We typically respond to MyChart messages within 1-2 business days.  For prescription refills, please ask your pharmacy to contact our office. Our fax number is [phone].  If you have an urgent issue when the clinic is closed that   cannot wait until the next business day, you can page your doctor at the number below.    Please note that while we do our best to be available for urgent issues outside of office hours, we are not available 24/7.   If you have an urgent issue and are unable to reach us, you may choose to seek medical care at  your doctor's office, retail clinic, urgent care center, or emergency room.  If you have a medical emergency, please immediately call 911 or go to the emergency department. In the event of inclement weather, please call our main line at [phone] for an update on the status of any delays or closures.  Dermatology Medication Tips: Please keep the boxes that topical medications come in in order to help keep track of the instructions about where and how to use these. Pharmacies typically print the medication instructions only on the boxes and not directly on the medication tubes.   If your medication is too expensive, please contact our office at [phone] or send us a message through MyChart.   We are unable to tell what your co-pay for medications will be in advance as this is different depending on your insurance coverage. However, we may be able to find a substitute medication at lower cost or fill out paperwork to get insurance to cover a needed medication.   If a prior authorization is required to get your medication covered by your insurance company, please allow us 1-2 business days to complete this process.  Drug prices often vary depending on where the prescription is filled and some pharmacies may offer cheaper prices.  The website www.goodrx.com contains coupons for medications through different pharmacies. The prices here do not account for what the cost may be with help from insurance (it may be cheaper with your insurance), but the website can give you the price if you did not use any insurance.  - You can print the associated coupon and take it with your prescription to the pharmacy.  - You may also stop by our office during regular business hours and pick up a GoodRx coupon card.  - If you need your prescription sent electronically to a different pharmacy, notify our office through [place] MyChart or by phone at [phone]    Skin Education :   I counseled the  patient regarding the following: Sun screen (SPF 30 or greater) should be applied during peak UV exposure (between 10am and 2pm) and reapplied after exercise or swimming.  The ABCDEs of melanoma were reviewed with the patient, and the importance of monthly self-examination of moles was emphasized. Should any moles change in shape or color, or itch, bleed or burn, pt will contact our office for evaluation sooner then their interval appointment.  Plan: Sunscreen Recommendations I recommended a broad spectrum sunscreen with a SPF of 30 or higher. I explained that SPF 30 sunscreens block approximately 97 percent of the sun's harmful rays. Sunscreens should be applied at least 15 minutes prior to expected sun exposure and then every 2 hours after that as long as sun exposure continues. If swimming or exercising sunscreen should be reapplied every 45 minutes to an hour after getting wet or sweating. One ounce, or the equivalent of a shot glass full of sunscreen, is adequate to protect the skin not covered by a bathing suit. I also recommended a lip balm with a sunscreen as well. Sun protective clothing can be used in lieu of sunscreen but must be   worn the entire time you are exposed to the sun's rays.  

## 2022-10-16 NOTE — Progress Notes (Signed)
HI Elon Jester,  Please call pt and notify that their bx results showed a severely abnormal mole that requires a full excision in office with Dr Onalee Hua  Please schedule a surgery in a 12:30pm slot.

## 2022-11-04 ENCOUNTER — Telehealth: Payer: Self-pay

## 2022-11-04 NOTE — Telephone Encounter (Signed)
I left a voicemail asking her to call back to schedule excision. She was sent a my chart message 10/09/22 and has not responded.  I am unsure of any previous notifications regarding a call back from myself to her. She needs an excision appt.

## 2022-12-01 ENCOUNTER — Ambulatory Visit: Payer: BC Managed Care – PPO | Attending: General Surgery

## 2022-12-01 VITALS — Wt 196.0 lb

## 2022-12-01 DIAGNOSIS — Z483 Aftercare following surgery for neoplasm: Secondary | ICD-10-CM | POA: Insufficient documentation

## 2022-12-01 NOTE — Therapy (Signed)
OUTPATIENT PHYSICAL THERAPY SOZO SCREENING NOTE   Patient Name: Donna Mcconnell MRN: [id number] DOB:1961-01-23, 62 y.o., female Today's Date: 12/01/2022  PCP: Creola Corn, MD REFERRING PROVIDER: Emelia Loron, MD   PT End of Session - 12/01/22 [phone]     Visit Number 2   # unchanged due to screen only   PT Start Time 0954    PT Stop Time 0957    PT Time Calculation (min) 3 min    Activity Tolerance Patient tolerated treatment well    Behavior During Therapy [facility] for tasks assessed/performed             Past Medical History:  Diagnosis Date   Allergy    shellfish   Ameloblastoma of jaw    s/p left jaw tumor resection April 2022 with bone graft July 2022 (Dr. Leonie Man)   Breast cancer [facility])    Complication of anesthesia    Coronary atherosclerosis    COVID 03/2019   fever, bodyaches   Dysplastic nevus 10/09/2022   left upper back   Fatty liver disease, nonalcoholic    Food allergy    GERD (gastroesophageal reflux disease)    History of radiation therapy    right breast - 05/23/2021-07/01/2021  Dr Antony Blackbird   Hyperlipidemia    Hypertension    PONV (postoperative nausea and vomiting)    Spondylosis    Past Surgical History:  Procedure Laterality Date   ADENOIDECTOMY     BREAST LUMPECTOMY WITH RADIOACTIVE SEED AND SENTINEL LYMPH NODE BIOPSY Right 04/04/2021   Procedure: RIGHT BREAST LUMPECTOMY WITH RADIOACTIVE SEED AND AXILLARY SENTINEL LYMPH NODE BIOPSY;  Surgeon: Emelia Loron, MD;  Location: [facility] OR;  Service: General;  Laterality: Right;   BREAST RECONSTRUCTION Right 04/15/2021   Procedure: RIGHT ONCOPLASTIC BREAST RECONSTRUCTION;  Surgeon: Glenna Fellows, MD;  Location: [facility] OR;  Service: Plastics;  Laterality: Right;   BREAST REDUCTION SURGERY Left 04/15/2021   Procedure: LEFT BREAST REDUCTION  (BREAST);  Surgeon: Glenna Fellows, MD;  Location: [facility] OR;  Service: Plastics;  Laterality: Left;   cercalage     COLONOSCOPY     10 yrs ago   MOUTH  SURGERY  03/28/2021   TONSILLECTOMY     TYMPANOSTOMY TUBE PLACEMENT     Patient Active Problem List   Diagnosis Date Noted   Genetic testing 03/05/2021   Family history of breast cancer 02/20/2021   Family history of prostate cancer 02/20/2021   Malignant neoplasm of upper-outer quadrant of right breast in female, estrogen receptor positive (HCC) 02/15/2021    REFERRING DIAG: Rt breast cancer  THERAPY DIAG: Aftercare following surgery for neoplasm  PERTINENT HISTORY: Patient was diagnosed on 01/14/2021 with right grade I-II invasive ductal carcinoma breast cancer. It measures 8 mm and has an area od calicifications measuring 1.8 cm in the upper outer quadrant.. It is ER/PR positive and HER2 negative with a Ki67 of 1%. She has a hx of a mandibular surgery due to a benign tumor in 07/2020 and had to have a bone graft in 10/2020. She is missing multiple teeth on the bottom left side.  PRECAUTIONS: lymphedema risk Rt UE  SUBJECTIVE: Pt returns for her 3 month L-Dex screen.   PAIN:  Are you having pain? No  SOZO SCREENING: Patient was assessed today using the SOZO machine to determine the lymphedema index score. This was compared to her baseline score. It was determined that she is within the recommended range when compared to her baseline and no further action  is needed at this time. She will return in 3 months for her next SOZO screen.   L-DEX FLOWSHEETS - 12/01/22 0900       L-DEX LYMPHEDEMA SCREENING   Measurement Type Unilateral    L-DEX MEASUREMENT EXTREMITY Upper Extremity    POSITION  Standing    DOMINANT SIDE Right    At Risk Side Right    BASELINE SCORE (UNILATERAL) -3.9    L-DEX SCORE (UNILATERAL) -4.6    VALUE CHANGE (UNILAT) -0.7              Hermenia Bers, PTA 12/01/2022, 9:59 AM

## 2022-12-11 ENCOUNTER — Ambulatory Visit: Payer: BC Managed Care – PPO | Admitting: Dermatology

## 2022-12-17 ENCOUNTER — Ambulatory Visit (INDEPENDENT_AMBULATORY_CARE_PROVIDER_SITE_OTHER): Payer: BC Managed Care – PPO | Admitting: Dermatology

## 2022-12-17 ENCOUNTER — Encounter: Payer: Self-pay | Admitting: Dermatology

## 2022-12-17 DIAGNOSIS — D225 Melanocytic nevi of trunk: Secondary | ICD-10-CM | POA: Diagnosis not present

## 2022-12-17 DIAGNOSIS — L988 Other specified disorders of the skin and subcutaneous tissue: Secondary | ICD-10-CM | POA: Diagnosis not present

## 2022-12-17 DIAGNOSIS — D239 Other benign neoplasm of skin, unspecified: Secondary | ICD-10-CM

## 2022-12-17 MED ORDER — DOXYCYCLINE HYCLATE 100 MG PO TABS: 100.0000 mg | ORAL_TABLET | Freq: Two times a day (BID) | ORAL | 0 refills | Status: AC

## 2022-12-17 MED ORDER — MUPIROCIN 2 % EX OINT: 1.0000 | TOPICAL_OINTMENT | Freq: Two times a day (BID) | CUTANEOUS | 0 refills | Status: AC

## 2022-12-17 NOTE — Progress Notes (Signed)
   Follow-Up Visit   Subjective  Donna Mcconnell is a 62 y.o. female who presents for the following: Excision of Severe Dysplastic Nevus at the left upper back  The following portions of the chart were reviewed this encounter and updated as appropriate: medications, allergies, medical history  Review of Systems:  No other skin or systemic complaints except as noted in HPI or Assessment and Plan.  Objective  Well appearing patient in no apparent distress; mood and affect are within normal limits.  A focused examination was performed of the following areas: Left upper back Relevant physical exam findings are noted in the Assessment and Plan.   left upper back 1.4 cm healing biopsy site        Assessment & Plan   Dysplastic nevus left upper back  Skin excision - left upper back  Lesion length (cm):  1.4 Margin per side (cm):  0.5 Informed consent: discussed and consent obtained   Timeout: patient name, date of birth, surgical site, and procedure verified   Procedure prep:  Patient was prepped and draped in usual sterile fashion Prep type:  Isopropyl alcohol and chlorhexidine Anesthesia: the lesion was anesthetized in a standard fashion   Anesthetic:  1% lidocaine w/ epinephrine 1-100,000 buffered w/ 8.4% NaHCO3 Instrument used: #15 blade   Hemostasis achieved with: pressure and electrodesiccation   Outcome: patient tolerated procedure well with no complications   Post-procedure details: sterile dressing applied and wound care instructions given   Dressing type: bandage and pressure dressing (mupirocin)    Skin repair - left upper back Complexity:  Complex Final length (cm):  5 Informed consent: discussed and consent obtained   Timeout: patient name, date of birth, surgical site, and procedure verified   Procedure prep:  Patient was prepped and draped in usual sterile fashion Procedure prep: Alcohol. Anesthesia: the lesion was anesthetized in a standard fashion    Anesthetic:  1% lidocaine w/ epinephrine 1-100,000 buffered w/ 8.4% NaHCO3 Reason for type of repair: reduce tension to allow closure, reduce the risk of dehiscence, infection, and necrosis, reduce subcutaneous dead space and avoid a hematoma, allow closure of the large defect, preserve normal anatomy, preserve normal anatomical and functional relationships and enhance both functionality and cosmetic results   Undermining: area extensively undermined   Subcutaneous layers (deep stitches):  Suture size:  3-0 Suture type: Vicryl (polyglactin 910)   Subcutaneous suture technique: Inverted dermal. Fine/surface layer approximation (top stitches):  Suture size:  3-0 Suture type: nylon   Suture type comment:  Nylon Stitches: horizontal mattress and simple running   Stitches comment:  Nylon Suture removal (days):  14 Hemostasis achieved with: suture and pressure Hemostasis achieved with comment:  Electrocautery Outcome: patient tolerated procedure well with no complications   Post-procedure details: sterile dressing applied and wound care instructions given   Dressing type: pressure dressing and bandage (Mupirocin)   Additional details:  Undermining Defect measured 1.0 cm  Specimen 1 - Surgical pathology Differential Diagnosis: Severe DN  Accession # [id number]  Check Margins: Yes     Return in about 2 weeks (around 12/31/2022) for Suture Removal, check spot on nose.  Jaclynn Guarneri, CMA, am acting as scribe for [employer], DO.   Documentation: I have reviewed the above documentation for accuracy and completeness, and I agree with the above.  Langston Reusing, DO

## 2022-12-17 NOTE — Patient Instructions (Signed)
AFTER- CARE OF SURGERY SITE 1. Leave the current dressing on for 18-24 hours and do not get it wet during that time. 2. After 24 hours wash site normally with mild soap/cleanser. 3. Apply a small amount of Mupirocin Ointment (This is a prescription sent to your local pharmacy) to the area. 4. Cover with a bandage using a non stick gauze pad and paper tape or Band Aid(s). 5. Repeat this twice each day for about 7 days, or as instructed by your provider. 6. Take the 100mg  Doxycyline every morning and evening with a bid meal for 5 days.  Should bleeding occur, apply pressure for 10-15 minutes to the site. If bleeding continues, apply pressure again  for 10-15 minutes. If bleeding persists despite applying consistent pressure, call the office or go to the nearest emergency room.  Call the office for any questions or concerns. NOTE: Pathology results will be reviewed at the time the sutures (stitches) are removed or you will be notified with  the results (which usually takes 7 days)  Supplies for surgical site care: 1. Non stick gauze pads and Band Aids 2. Mild soap/cleanser ([address] Lever 2000, Cetaphil, etc.) 3. Paper tape 4. Prescription Mupirocin Ointment or  Aquaphor healing ointment if you're waiting for your prescription.  Important Information  Due to recent changes in healthcare laws, you may see results of your pathology and/or laboratory studies on MyChart before the doctors have had a chance to review them. We understand that in some cases there may be results that are confusing or concerning to you. Please understand that not all results are received at the same time and often the doctors may need to interpret multiple results in order to provide you with the best plan of care or course of treatment. Therefore, we ask that you please give [place] 2 business days to thoroughly review all your results before contacting the office for  clarification. Should we see a critical lab result, you will be contacted sooner.   If You Need Anything After Your Visit  If you have any questions or concerns for your doctor, please call our main line at [phone] If no one answers, please leave a voicemail as directed and we will return your call as soon as possible. Messages left after 4 pm will be answered the following business day.   You may also send [place] a message via MyChart. We typically respond to MyChart messages within 1-2 business days.  For prescription refills, please ask your pharmacy to contact our office. Our fax number is [phone].  If you have an urgent issue when the clinic is closed that cannot wait until the next business day, you can page your doctor at the number below.    Please note that while we do our best to be available for urgent issues outside of office hours, we are not available 24/7.   If you have an urgent issue and are unable to reach [place], you may choose to seek medical care at your doctor's office, retail clinic, urgent care center, or emergency room.  If you have a medical emergency, please immediately call 911 or go to the emergency department. In the event of inclement weather, please call our main line at [phone] for an update on the status of any delays or closures.  Dermatology Medication Tips: Please keep the boxes that topical medications come in in order to help keep track of the instructions about where and how to use these. Pharmacies typically print the  medication instructions only on the boxes and not directly on the medication tubes.   If your medication is too expensive, please contact our office at [phone] or send [place] a message through MyChart.   We are unable to tell what your co-pay for medications will be in advance as this is different depending on your insurance coverage. However, we may be able to find a substitute medication at lower cost or fill out paperwork to  get insurance to cover a needed medication.   If a prior authorization is required to get your medication covered by your insurance company, please allow [place] 1-2 business days to complete this process.  Drug prices often vary depending on where the prescription is filled and some pharmacies may offer cheaper prices.  The website www.goodrx.com contains coupons for medications through different pharmacies. The prices here do not account for what the cost may be with help from insurance (it may be cheaper with your insurance), but the website can give you the price if you did not use any insurance.  - You can print the associated coupon and take it with your prescription to the pharmacy.  - You may also stop by our office during regular business hours and pick up a GoodRx coupon card.  - If you need your prescription sent electronically to a different pharmacy, notify our office through [facility] or by phone at [phone]

## 2022-12-31 NOTE — Progress Notes (Signed)
Surgical excision pathology report was reviewed and showed clear margins.  No additional treatment required.

## 2023-01-01 ENCOUNTER — Ambulatory Visit (INDEPENDENT_AMBULATORY_CARE_PROVIDER_SITE_OTHER): Payer: BC Managed Care – PPO | Admitting: Dermatology

## 2023-01-01 DIAGNOSIS — Z4802 Encounter for removal of sutures: Secondary | ICD-10-CM

## 2023-01-01 NOTE — Progress Notes (Signed)
   Follow-Up Visit   Subjective  VIDALIA OFLYNN is a 62 y.o. female who presents for the following: Suture removal  Pathology showed DN with clear margins  The following portions of the chart were reviewed this encounter and updated as appropriate: medications, allergies, medical history  Review of Systems:  No other skin or systemic complaints except as noted in HPI or Assessment and Plan.  Objective  Well appearing patient in no apparent distress; mood and affect are within normal limits.  Areas Examined: back Relevant physical exam findings are noted in the Assessment and Plan.    Assessment & Plan    Encounter for Removal of Sutures - Incision site is clean, dry and intact. - Wound cleansed, sutures removed, wound cleansed. - Discussed pathology results showing clear margins - Patient advised to keep steri-strips dry until they fall off. - Scars remodel for a full year. - Told patient she can apply over-the-counter silicone scar cream once to twice a day to help with scar remodeling if desired. - Patient advised to call with any concerns or if they notice any new or changing lesions.  No follow-ups on file.  Candis Schatz

## 2023-01-14 ENCOUNTER — Other Ambulatory Visit: Payer: Self-pay

## 2023-01-14 DIAGNOSIS — C50411 Malignant neoplasm of upper-outer quadrant of right female breast: Secondary | ICD-10-CM

## 2023-01-14 NOTE — Progress Notes (Unsigned)
Patient Care Team: Creola Corn, MD as PCP - General (Internal Medicine) Emelia Loron, MD as Consulting Physician (General Surgery) Malachy Mood, MD as Consulting Physician (Hematology) Antony Blackbird, MD as Consulting Physician (Radiation Oncology)   CHIEF COMPLAINT: Follow up right breast cancer   Oncology History Overview Note   Cancer Staging  Malignant neoplasm of upper-outer quadrant of right breast in female, estrogen receptor positive [facility]) Staging form: Breast, AJCC 8th Edition - Clinical stage from 02/08/2021: Stage IA (cT1b, cN0, cM0, G2, ER+, PR+, HER2-) - Signed by Malachy Mood, MD on 02/19/2021 - Pathologic stage from 04/04/2021: Stage IA (pT1c, pN0, cM0, G1, ER+, PR+, HER2-, Oncotype DX score: 14) - Signed by Malachy Mood, MD on 07/08/2021     Malignant neoplasm of upper-outer quadrant of right breast in female, estrogen receptor positive (HCC)  02/02/2021 Mammogram   Right Diagnostic MM and Right Breast US  IMPRESSION: 1.  Suspicious 0.8 cm mass in the right breast at the 12 position. 2. Suspicious calcifications adjacent to the right breast mass spanning 1.1 cm.  No lymphadenopathy seen in right axilla.   02/08/2021 Cancer Staging   Staging form: Breast, AJCC 8th Edition - Clinical stage from 02/08/2021: Stage IA (cT1b, cN0, cM0, G2, ER+, PR+, HER2-) - Signed by Malachy Mood, MD on 02/19/2021 Stage prefix: Initial diagnosis Histologic grading system: 3 grade system   02/08/2021 Pathology Results   Diagnosis 1. Breast, right, needle core biopsy, 12 o'clock, ribbon shaped clip - INVASIVE DUCTAL CARCINOMA - DUCTAL CARCINOMA IN SITU - CALCIFICATIONS - SEE COMMENT 2. Breast, right, needle core biopsy, upper inner quadrant, x shaped clip - FLAT EPITHELIAL ATYPIA WITH CALCIFICATIONS - LOBULAR NEOPLASIA (ATYPICAL LOBULAR HYPERPLASIA) Microscopic Comment 1. Based on the biopsy, the carcinoma appears Nottingham grade 1-2 of 3 and measures 0.6 cm in greatest linear  extent.  1. PROGNOSTIC INDICATORS Results: The tumor cells are EQUIVOCAL for Her2 (2+). Her2 by FISH will be performed and results reported separately. Estrogen Receptor: >95%, POSITIVE, STRONG STAINING INTENSITY Progesterone Receptor: >95%, POSITIVE, STRONG STAINING INTENSITY Proliferation Marker Ki67: 1%  1. FLUORESCENCE IN-SITU HYBRIDIZATION Results: GROUP 5: HER2 **NEGATIVE**   02/15/2021 Initial Diagnosis   Malignant neoplasm of upper-outer quadrant of right breast in female, estrogen receptor positive (HCC)   03/04/2021 Genetic Testing   Negative hereditary cancer genetic testing: no pathogenic variants detected in [employer] CustomNext-Cancer +RNAinsight Panel.  The report date is March 04, 2021.   The CustomNext-Cancer+RNAinsight panel offered by [employer] includes sequencing and rearrangement analysis for the following 47 genes:  APC, ATM, AXIN2, BARD1, BMPR1A, BRCA1, BRCA2, BRIP1, CDH1, CDK4, CDKN2A, CHEK2, DICER1, EPCAM, GREM1, HOXB13, MEN1, MLH1, MSH2, MSH3, MSH6, MUTYH, NBN, NF1, NF2, NTHL1, PALB2, PMS2, POLD1, POLE, PTEN, RAD51C, RAD51D, RECQL, RET, SDHA, SDHAF2, SDHB, SDHC, SDHD, SMAD4, SMARCA4, STK11, TP53, TSC1, TSC2, and VHL.  RNA data is routinely analyzed for use in variant interpretation for all genes.   04/04/2021 Cancer Staging   Staging form: Breast, AJCC 8th Edition - Pathologic stage from 04/04/2021: Stage IA (pT1c, pN0, cM0, G1, ER+, PR+, HER2-, Oncotype DX score: 14) - Signed by Malachy Mood, MD on 07/08/2021 Stage prefix: Initial diagnosis Multigene prognostic tests performed: Oncotype DX Recurrence score range: Greater than or equal to 11 Histologic grading system: 3 grade system   04/04/2021 Definitive Surgery   FINAL MICROSCOPIC DIAGNOSIS:   A. BREAST, RIGHT, LUMPECTOMY:  - Invasive ductal carcinoma, 1.1 cm, grade 1  - Ductal carcinoma in situ, low-grade  - Resection margins are  negative for carcinoma - closest is the anterior  margin at 0.8 cm  -  Atypical lobular hyperplasia  - Biopsy site changes  - See oncology table   B. LYMPH NODE, RIGHT AXILLA, SENTINEL, EXCISION:  - Lymph node, negative for carcinoma (0/1)   C. LYMPH NODE, RIGHT AXILLA, SENTINEL, EXCISION:  - Lymph node, negative for carcinoma (0/1)   D. LYMPH NODE, RIGHT AXILLA, SENTINEL, EXCISION:  - Lymph node, negative for carcinoma (0/1)   E. BREAST, RIGHT POSTERIOR MARGIN, EXCISION:  - Atypical lobular hyperplasia    04/04/2021 Oncotype testing   Oncotype DX was obtained on the final surgical sample and the recurrence score of 14 predicts a risk of recurrence outside the breast over the next 9 years of 4%, if the patient's only systemic therapy is an antiestrogen for 5 years.  It also predicts no benefit from chemotherapy.    04/15/2021 Pathology Results   FINAL MICROSCOPIC DIAGNOSIS:   A. BREAST, RIGHT, MAMMOPLASTY:  - Benign mammary parenchyma with fat necrosis and giant cell reaction, 121 grams.  - Negative for atypical proliferative breast disease.   B. BREAST, LEFT, MAMMOPLASTY:  - Benign mammary parenchyma, 175 grams.  - Negative for atypical proliferative breast disease.   05/23/2021 - 07/01/2021 Radiation Therapy   Site Technique Total Dose (Gy) Dose per Fx (Gy) Completed Fx Beam Energies  Breast, Right: Breast_R 3D 50.4/50.4 1.8 28/28 6XFFF, 10XFFF     07/2021 -  Anti-estrogen oral therapy   Exemestane daily      CURRENT THERAPY: Exemestane daily, starting 07/2021  INTERVAL HISTORY Donna Mcconnell returns for follow up as scheduled. Last seen by me 06/2022. She continues Exemestane. Mammogram scheduled later this month.   ROS   Past Medical History:  Diagnosis Date   Allergy    shellfish   Ameloblastoma of jaw    s/p left jaw tumor resection April 2022 with bone graft July 2022 (Dr. Leonie Man)   Breast cancer [facility])    Complication of anesthesia    Coronary atherosclerosis    COVID 03/2019   fever, bodyaches   Dysplastic nevus 10/09/2022    left upper back - severe  excised 12/17/2022   Fatty liver disease, nonalcoholic    Food allergy    GERD (gastroesophageal reflux disease)    History of radiation therapy    right breast - 05/23/2021-07/01/2021  Dr Antony Blackbird   Hyperlipidemia    Hypertension    PONV (postoperative nausea and vomiting)    Spondylosis      Past Surgical History:  Procedure Laterality Date   ADENOIDECTOMY     BREAST LUMPECTOMY WITH RADIOACTIVE SEED AND SENTINEL LYMPH NODE BIOPSY Right 04/04/2021   Procedure: RIGHT BREAST LUMPECTOMY WITH RADIOACTIVE SEED AND AXILLARY SENTINEL LYMPH NODE BIOPSY;  Surgeon: Emelia Loron, MD;  Location: [facility] OR;  Service: General;  Laterality: Right;   BREAST RECONSTRUCTION Right 04/15/2021   Procedure: RIGHT ONCOPLASTIC BREAST RECONSTRUCTION;  Surgeon: Glenna Fellows, MD;  Location: [facility] OR;  Service: Plastics;  Laterality: Right;   BREAST REDUCTION SURGERY Left 04/15/2021   Procedure: LEFT BREAST REDUCTION  (BREAST);  Surgeon: Glenna Fellows, MD;  Location: [facility] OR;  Service: Plastics;  Laterality: Left;   cercalage     COLONOSCOPY     10 yrs ago   MOUTH SURGERY  03/28/2021   TONSILLECTOMY     TYMPANOSTOMY TUBE PLACEMENT       Outpatient Encounter Medications as of 01/15/2023  Medication Sig   CALCIUM-VITAMIN D PO Take  1 tablet by mouth daily.   EPINEPHrine (EPIPEN 2-PAK) 0.3 mg/0.3 mL IJ SOAJ injection Inject 0.3 mg into the muscle as needed for anaphylaxis.   exemestane (AROMASIN) 25 MG tablet Take 1 tablet (25 mg total) by mouth daily after breakfast.   mupirocin ointment (BACTROBAN) 2 % Apply 1 Application topically 2 (two) times daily. Apply to wound and cover with a bandaid   pantoprazole (PROTONIX) 40 MG tablet Take 40 mg by mouth daily.   penciclovir (DENAVIR) 1 % cream Apply 1 Application topically every 2 (two) hours. Apply at first sign of a fever blister, apply daily until resolved   pimecrolimus (ELIDEL) 1 % cream Apply topically 2 (two) times daily.    Ruxolitinib Phosphate (OPZELURA) 1.5 % CREA Apply 1 Application topically 2 (two) times daily as needed.   Ruxolitinib Phosphate (OPZELURA) 1.5 % CREA Apply 1 Application topically 2 (two) times daily. Twice daily around eyelids when needed   valsartan (DIOVAN) 80 MG tablet Take 80 mg by mouth daily.   No facility-administered encounter medications on file as of 01/15/2023.     There were no vitals filed for this visit. There is no height or weight on file to calculate BMI.   PHYSICAL EXAM GENERAL:alert, no distress and comfortable SKIN: no rash  EYES: sclera clear NECK: without mass LYMPH:  no palpable cervical or supraclavicular lymphadenopathy  LUNGS: clear with normal breathing effort HEART: regular rate & rhythm, no lower extremity edema ABDOMEN: abdomen soft, non-tender and normal bowel sounds NEURO: alert & oriented x 3 with fluent speech, no focal motor/sensory deficits Breast exam:  PAC without erythema    CBC    Component Value Date/Time   WBC 4.5 07/14/2022 0935   RBC 4.54 07/14/2022 0935   HGB 13.4 07/14/2022 0935   HGB 13.7 02/20/2021 1225   HCT 40.8 07/14/2022 0935   PLT 312 07/14/2022 0935   PLT 289 02/20/2021 1225   MCV 89.9 07/14/2022 0935   MCH 29.5 07/14/2022 0935   MCHC 32.8 07/14/2022 0935   RDW 14.3 07/14/2022 0935   LYMPHSABS 1.3 07/14/2022 0935   MONOABS 0.4 07/14/2022 0935   EOSABS 0.2 07/14/2022 0935   BASOSABS 0.0 07/14/2022 0935     CMP     Component Value Date/Time   NA 140 07/14/2022 0935   K 3.9 07/14/2022 0935   CL 106 07/14/2022 0935   CO2 27 07/14/2022 0935   GLUCOSE 108 (H) 07/14/2022 0935   BUN 20 07/14/2022 0935   CREATININE 0.77 07/14/2022 0935   CREATININE 0.80 02/20/2021 1225   CALCIUM 9.6 07/14/2022 0935   PROT 7.1 07/14/2022 0935   ALBUMIN 4.3 07/14/2022 0935   AST 23 07/14/2022 0935   AST 33 02/20/2021 1225   ALT 15 07/14/2022 0935   ALT 21 02/20/2021 1225   ALKPHOS 121 07/14/2022 0935   BILITOT 0.8 07/14/2022  0935   BILITOT 1.0 02/20/2021 1225   GFRNONAA >60 07/14/2022 0935   GFRNONAA >60 02/20/2021 1225     ASSESSMENT & PLAN:Donna Mcconnell is a 62 y.o. post-menopausal female with    1. Malignant neoplasm of upper-outer quadrant of right breast, IDC and DCIS, Stage IA, p(T1b, N0), ER+/PR+/HER2-, Grade 2  -Diagnosed 02/2021, S/p right lumpectomy on 04/04/21, path showed IDC and DCIS, as well as ALH. -Oncotype RS of 14, low risk. -s/p radiation therapy under Dr. Roselind Messier 1/26-07/01/21. -she started exemestane in early 07/2021. She is tolerating well with manageable hot flashes and joint stiffness.  -Next mammogram  due 12/2022   2. Bone Health  -DEXA on 03/14/21 was normal, with lowest T-score of -0.7 at left radius. -plan to repeat every 2 years on exemestane -Continue calcium, vitamin D, and weightbearing exercise   3.  Appropriate health maintenance -Continue healthy active lifestyle and age-appropriate cancer screenings, avoid smoking, limit alcohol, sunscreen, and other risk reducing behavior      PLAN:  No orders of the defined types were placed in this encounter.     All questions were answered. The patient knows to call the clinic with any problems, questions or concerns. No barriers to learning were detected. I spent *** counseling the patient face to face. The total time spent in the appointment was *** and more than 50% was on counseling, review of test results, and coordination of care.   Santiago Glad, NP-C @DATE @

## 2023-01-15 ENCOUNTER — Inpatient Hospital Stay: Payer: BC Managed Care – PPO | Attending: Nurse Practitioner

## 2023-01-15 ENCOUNTER — Encounter: Payer: Self-pay | Admitting: Nurse Practitioner

## 2023-01-15 ENCOUNTER — Inpatient Hospital Stay: Payer: BC Managed Care – PPO | Admitting: Nurse Practitioner

## 2023-01-15 VITALS — BP 134/86 | HR 64 | Temp 98.0°F | Resp 16 | Ht 62.0 in | Wt 205.0 lb

## 2023-01-15 DIAGNOSIS — N951 Menopausal and female climacteric states: Secondary | ICD-10-CM | POA: Diagnosis not present

## 2023-01-15 DIAGNOSIS — Z79811 Long term (current) use of aromatase inhibitors: Secondary | ICD-10-CM | POA: Diagnosis not present

## 2023-01-15 DIAGNOSIS — M2550 Pain in unspecified joint: Secondary | ICD-10-CM | POA: Insufficient documentation

## 2023-01-15 DIAGNOSIS — C50411 Malignant neoplasm of upper-outer quadrant of right female breast: Secondary | ICD-10-CM

## 2023-01-15 DIAGNOSIS — Z170 Estrogen receptor positive status [ER+]: Secondary | ICD-10-CM | POA: Insufficient documentation

## 2023-01-15 LAB — CBC WITH DIFFERENTIAL (CANCER CENTER ONLY)
Abs Immature Granulocytes: 0.01 10*3/uL (ref 0.00–0.07)
Basophils Absolute: 0 10*3/uL (ref 0.0–0.1)
Basophils Relative: 1 %
Eosinophils Absolute: 0.2 10*3/uL (ref 0.0–0.5)
Eosinophils Relative: 4 %
HCT: 41.8 % (ref 36.0–46.0)
Hemoglobin: 13.6 g/dL (ref 12.0–15.0)
Immature Granulocytes: 0 %
Lymphocytes Relative: 26 %
Lymphs Abs: 1.3 10*3/uL (ref 0.7–4.0)
MCH: 30.5 pg (ref 26.0–34.0)
MCHC: 32.5 g/dL (ref 30.0–36.0)
MCV: 93.7 fL (ref 80.0–100.0)
Monocytes Absolute: 0.6 10*3/uL (ref 0.1–1.0)
Monocytes Relative: 13 %
Neutro Abs: 2.7 10*3/uL (ref 1.7–7.7)
Neutrophils Relative %: 56 %
Platelet Count: 318 10*3/uL (ref 150–400)
RBC: 4.46 MIL/uL (ref 3.87–5.11)
RDW: 13.1 % (ref 11.5–15.5)
WBC Count: 4.8 10*3/uL (ref 4.0–10.5)
nRBC: 0 % (ref 0.0–0.2)

## 2023-01-15 LAB — CMP (CANCER CENTER ONLY)
ALT: 19 U/L (ref 0–44)
AST: 28 U/L (ref 15–41)
Albumin: 4.3 g/dL (ref 3.5–5.0)
Alkaline Phosphatase: 131 U/L — ABNORMAL HIGH (ref 38–126)
Anion gap: 6 (ref 5–15)
BUN: 21 mg/dL (ref 8–23)
CO2: 29 mmol/L (ref 22–32)
Calcium: 10.1 mg/dL (ref 8.9–10.3)
Chloride: 104 mmol/L (ref 98–111)
Creatinine: 0.83 mg/dL (ref 0.44–1.00)
GFR, Estimated: >60 mL/min (ref >60)
Glucose, Bld: 93 mg/dL (ref 70–99)
Potassium: 5 mmol/L (ref 3.5–5.1)
Sodium: 139 mmol/L (ref 135–145)
Total Bilirubin: 0.9 mg/dL (ref 0.3–1.2)
Total Protein: 7.3 g/dL (ref 6.5–8.1)

## 2023-01-20 ENCOUNTER — Ambulatory Visit (INDEPENDENT_AMBULATORY_CARE_PROVIDER_SITE_OTHER): Payer: BC Managed Care – PPO | Admitting: Dermatology

## 2023-01-20 DIAGNOSIS — Z4802 Encounter for removal of sutures: Secondary | ICD-10-CM

## 2023-01-20 NOTE — Progress Notes (Signed)
   Follow-Up Visit   Subjective  Donna Mcconnell is a 62 y.o. female who presents for the following: Suture removal  The following portions of the chart were reviewed this encounter and updated as appropriate: medications, allergies, medical history  Review of Systems:  No other skin or systemic complaints except as noted in HPI or Assessment and Plan.  Objective  Well appearing patient in no apparent distress; mood and affect are within normal limits.  Areas Examined: Left Posterior Shoulder  Relevant physical exam findings are noted in the Assessment and Plan.   Assessment & Plan   Encounter for Removal of Sutures - Incision site is clean, dry and intact. - Wound cleansed, sutures removed - Scars remodel for a full year. - Patient advised to call with any concerns or if they notice any new or changing lesions.  No follow-ups on file.  Suzie Portela Ager

## 2023-01-28 ENCOUNTER — Encounter: Payer: Self-pay | Admitting: Dermatology

## 2023-01-28 ENCOUNTER — Ambulatory Visit: Payer: BC Managed Care – PPO | Admitting: Dermatology

## 2023-01-28 VITALS — BP 124/82 | HR 80

## 2023-01-28 DIAGNOSIS — L719 Rosacea, unspecified: Secondary | ICD-10-CM

## 2023-01-28 DIAGNOSIS — L718 Other rosacea: Secondary | ICD-10-CM

## 2023-01-28 NOTE — Patient Instructions (Addendum)
Hello [name],  Thank you for visiting [place] today. We appreciate your commitment to improving your health and managing your skin condition. Here is a summary of the key instructions from today's consultation:  - Hydrocortisone Use: Apply on the eyelids for 5 to 7 days, then take a break for at least two weeks to avoid potential side effects like glaucoma.  - Ointment for Night: Use Vanicream Ointment or Aquaphor nightly to help with irritation.  - Opzelura: Continue using for inflammation and itch. It is safer for longer use than hydrocortisone.  - Mascara Use: Monitor any changes or reactions. Use consistently for 2 weeks, then pause to observe any skin reactions.  - Diet and Rosacea: Monitor if consumption of spicy foods, wine, or alcohol correlates with rosacea flare-ups.  - Patch Testing: Consider consulting Donna Mcconnell for patch testing to identify specific allergens.  - Follow-Up Appointment: Schedule a follow-up in 3 months to review any patterns or changes in your condition.  - Potential Medication: If rosacea persists, discuss the possibility of using doxycycline (40-50 mg daily) to manage flare-ups.  Please feel free to reach out if you have any questions or need further clarification on your treatment plan.  Warm regards,  Dr. [facility]     Important Information   Due to recent changes in healthcare laws, you may see results of your pathology and/or laboratory studies on MyChart before the doctors have had a chance to review them. We understand that in some cases there may be results that are confusing or concerning to you. Please understand that not all results are received at the same time and often the doctors may need to interpret multiple results in order to provide you with the best plan of care or course of treatment. Therefore, we ask that you please give [place] 2 business days to thoroughly review all your results before contacting the office for  clarification. Should we see a critical lab result, you will be contacted sooner.     If You Need Anything After Your Visit   If you have any questions or concerns for your doctor, please call our main line at [phone]. If no one answers, please leave a voicemail as directed and we will return your call as soon as possible. Messages left after 4 pm will be answered the following business day.    You may also send [place] a message via MyChart. We typically respond to MyChart messages within 1-2 business days.  For prescription refills, please ask your pharmacy to contact our office. Our fax number is [phone].  If you have an urgent issue when the clinic is closed that cannot wait until the next business day, you can page your doctor at the number below.     Please note that while we do our best to be available for urgent issues outside of office hours, we are not available 24/7.    If you have an urgent issue and are unable to reach [place], you may choose to seek medical care at your doctor's office, retail clinic, urgent care center, or emergency room.   If you have a medical emergency, please immediately call 911 or go to the emergency department. In the event of inclement weather, please call our main line at [phone] for an update on the status of any delays or closures.  Dermatology Medication Tips: Please keep the boxes that topical medications come in in order to help keep track of the instructions about where and  how to use these. Pharmacies typically print the medication instructions only on the boxes and not directly on the medication tubes.   If your medication is too expensive, please contact our office at [phone] or send [place] a message through MyChart.    We are unable to tell what your co-pay for medications will be in advance as this is different depending on your insurance coverage. However, we may be able to find a substitute medication at lower cost or fill out  paperwork to get insurance to cover a needed medication.    If a prior authorization is required to get your medication covered by your insurance company, please allow [place] 1-2 business days to complete this process.   Drug prices often vary depending on where the prescription is filled and some pharmacies may offer cheaper prices.   The website www.goodrx.com contains coupons for medications through different pharmacies. The prices here do not account for what the cost may be with help from insurance (it may be cheaper with your insurance), but the website can give you the price if you did not use any insurance.  - You can print the associated coupon and take it with your prescription to the pharmacy.  - You may also stop by our office during regular business hours and pick up a GoodRx coupon card.  - If you need your prescription sent electronically to a different pharmacy, notify our office through [facility] or by phone at [phone]

## 2023-01-28 NOTE — Progress Notes (Signed)
   Follow-Up Visit   Subjective  Donna Mcconnell is a 62 y.o. female who presents for the following: irritation around eyes  Pt has irritation around her eyes that comes and goes. Sometimes they look like pimples, sometimes they look like blisters. They itch on occasion. She used opzelura which did not help. She has also tried hydrocortisone 2.5% which does help. Today is a good day   The following portions of the chart were reviewed this encounter and updated as appropriate: medications, allergies, medical history  Review of Systems:  No other skin or systemic complaints except as noted in HPI or Assessment and Plan.  Objective  Well appearing patient in no apparent distress; mood and affect are within normal limits.  A focused examination was performed of the following areas: face  Relevant exam findings are noted in the Assessment and Plan.  Picture proven  erythema with scattered inflammatory papules          Assessment & Plan   OCULAR ROSACEA Chronic and persistent condition with duration or expected duration over one year. Condition is symptomatic/ bothersome to patient. Not currently at goal.   Ocular Rosacea is a chronic progressive skin condition usually affecting the face of adults, causing redness and/or acne bumps. It is treatable but not curable. It sometimes affects the eyes (ocular rosacea) as well. It may respond to topical and/or systemic medication and can flare with stress, sun exposure, alcohol, exercise, topical steroids (including hydrocortisone/cortisone 10) and some foods.  Daily application of broad spectrum spf 30+ sunscreen to face is recommended to reduce flares.   Treatment Plan -Continue using for inflammation and itch - Vanicream Ointment  at night to help with irritation. Monitor reactions after use of mascara and consumption of foods/alcohol for rosacea flare.  Return in about 3 months (around 04/30/2023), or dermatitis.  Owens Shark,  CMA, am acting as scribe for [employer], DO.   Documentation: I have reviewed the above documentation for accuracy and completeness, and I agree with the above.  Donna Reusing, DO

## 2023-02-04 ENCOUNTER — Ambulatory Visit
Admission: RE | Admit: 2023-02-04 | Discharge: 2023-02-04 | Disposition: A | Discharge status: Home / Self Care | Payer: BC Managed Care – PPO | Source: Ambulatory Visit | Attending: Nurse Practitioner

## 2023-02-04 DIAGNOSIS — C50411 Malignant neoplasm of upper-outer quadrant of right female breast: Secondary | ICD-10-CM

## 2023-02-04 DIAGNOSIS — Z853 Personal history of malignant neoplasm of breast: Secondary | ICD-10-CM | POA: Diagnosis not present

## 2023-03-02 DIAGNOSIS — H2513 Age-related nuclear cataract, bilateral: Secondary | ICD-10-CM | POA: Diagnosis not present

## 2023-03-09 ENCOUNTER — Ambulatory Visit: Payer: BC Managed Care – PPO | Attending: General Surgery

## 2023-03-09 VITALS — Wt 203.2 lb

## 2023-03-09 DIAGNOSIS — Z124 Encounter for screening for malignant neoplasm of cervix: Secondary | ICD-10-CM | POA: Diagnosis not present

## 2023-03-09 DIAGNOSIS — Z01419 Encounter for gynecological examination (general) (routine) without abnormal findings: Secondary | ICD-10-CM | POA: Diagnosis not present

## 2023-03-09 DIAGNOSIS — Z483 Aftercare following surgery for neoplasm: Secondary | ICD-10-CM | POA: Insufficient documentation

## 2023-03-09 DIAGNOSIS — Z1330 Encounter for screening examination for mental health and behavioral disorders, unspecified: Secondary | ICD-10-CM | POA: Diagnosis not present

## 2023-03-09 DIAGNOSIS — N952 Postmenopausal atrophic vaginitis: Secondary | ICD-10-CM | POA: Diagnosis not present

## 2023-03-09 NOTE — Therapy (Signed)
OUTPATIENT PHYSICAL THERAPY SOZO SCREENING NOTE   Patient Name: Donna Mcconnell MRN: [id number] DOB:[birth year], 62 y.o., female Today's Date: 03/09/2023  PCP: Creola Corn, MD REFERRING PROVIDER: Emelia Loron, MD   PT End of Session - 03/09/23 [phone]     Visit Number 2   # unchanged due to screen only   PT Start Time 0926    PT Stop Time 0932    PT Time Calculation (min) 6 min    Activity Tolerance Patient tolerated treatment well    Behavior During Therapy [facility] for tasks assessed/performed             Past Medical History:  Diagnosis Date   Allergy    shellfish   Ameloblastoma of jaw    s/p left jaw tumor resection April 2022 with bone graft July 2022 (Dr. Leonie Man)   Breast cancer [facility])    Complication of anesthesia    Coronary atherosclerosis    COVID 03/2019   fever, bodyaches   Dysplastic nevus 10/09/2022   left upper back - severe  excised 12/17/2022   Fatty liver disease, nonalcoholic    Food allergy    GERD (gastroesophageal reflux disease)    History of radiation therapy    right breast - 05/23/2021-07/01/2021  Dr Antony Blackbird   Hyperlipidemia    Hypertension    PONV (postoperative nausea and vomiting)    Spondylosis    Past Surgical History:  Procedure Laterality Date   ADENOIDECTOMY     BREAST LUMPECTOMY WITH RADIOACTIVE SEED AND SENTINEL LYMPH NODE BIOPSY Right 04/04/2021   Procedure: RIGHT BREAST LUMPECTOMY WITH RADIOACTIVE SEED AND AXILLARY SENTINEL LYMPH NODE BIOPSY;  Surgeon: Emelia Loron, MD;  Location: [facility] OR;  Service: General;  Laterality: Right;   BREAST RECONSTRUCTION Right 04/15/2021   Procedure: RIGHT ONCOPLASTIC BREAST RECONSTRUCTION;  Surgeon: Glenna Fellows, MD;  Location: [facility] OR;  Service: Plastics;  Laterality: Right;   BREAST REDUCTION SURGERY Left 04/15/2021   Procedure: LEFT BREAST REDUCTION  (BREAST);  Surgeon: Glenna Fellows, MD;  Location: [facility] OR;  Service: Plastics;  Laterality: Left;   cercalage     COLONOSCOPY      10 yrs ago   MOUTH SURGERY  03/28/2021   TONSILLECTOMY     TYMPANOSTOMY TUBE PLACEMENT     Patient Active Problem List   Diagnosis Date Noted   Genetic testing 03/05/2021   Family history of breast cancer 02/20/2021   Family history of prostate cancer 02/20/2021   Malignant neoplasm of upper-outer quadrant of right breast in female, estrogen receptor positive (HCC) 02/15/2021    REFERRING DIAG: Rt breast cancer  THERAPY DIAG: Aftercare following surgery for neoplasm  PERTINENT HISTORY: Patient was diagnosed on 01/14/2021 with right grade I-II invasive ductal carcinoma breast cancer. It measures 8 mm and has an area od calicifications measuring 1.8 cm in the upper outer quadrant.. It is ER/PR positive and HER2 negative with a Ki67 of 1%. She has a hx of a mandibular surgery due to a benign tumor in 07/2020 and had to have a bone graft in 10/2020. She is missing multiple teeth on the bottom left side.  PRECAUTIONS: lymphedema risk Rt UE  SUBJECTIVE: Pt returns for her 3 month L-Dex screen.   PAIN:  Are you having pain? No  SOZO SCREENING: Patient was assessed today using the SOZO machine to determine the lymphedema index score. This was compared to her baseline score. It was determined that she is within the recommended range when compared to her  baseline and no further action is needed at this time. She will return in 3 months for her next SOZO screen.   L-DEX FLOWSHEETS - 03/09/23 0900       L-DEX LYMPHEDEMA SCREENING   Measurement Type Unilateral    L-DEX MEASUREMENT EXTREMITY Upper Extremity    POSITION  Standing    DOMINANT SIDE Right    At Risk Side Right    BASELINE SCORE (UNILATERAL) -3.9    L-DEX SCORE (UNILATERAL) -4.3    VALUE CHANGE (UNILAT) -0.4            P: Pt to begin 6 month SOZO ext.   Hermenia Bers, PTA 03/09/2023, 9:33 AM

## 2023-05-04 ENCOUNTER — Ambulatory Visit: Payer: BC Managed Care – PPO | Admitting: Dermatology

## 2023-05-07 ENCOUNTER — Ambulatory Visit: Payer: BC Managed Care – PPO | Admitting: Dermatology

## 2023-05-07 ENCOUNTER — Ambulatory Visit
Admission: RE | Admit: 2023-05-07 | Discharge: 2023-05-07 | Disposition: A | Discharge status: Home / Self Care | Payer: BC Managed Care – PPO | Source: Ambulatory Visit | Attending: Nurse Practitioner | Admitting: Nurse Practitioner

## 2023-05-07 DIAGNOSIS — E2839 Other primary ovarian failure: Secondary | ICD-10-CM | POA: Diagnosis not present

## 2023-05-07 DIAGNOSIS — N958 Other specified menopausal and perimenopausal disorders: Secondary | ICD-10-CM | POA: Diagnosis not present

## 2023-05-07 DIAGNOSIS — Z170 Estrogen receptor positive status [ER+]: Secondary | ICD-10-CM

## 2023-06-04 ENCOUNTER — Telehealth: Payer: Self-pay | Admitting: Nurse Practitioner

## 2023-06-04 NOTE — Telephone Encounter (Signed)
 Rescheduled appointments per provider request. Left the patient a voicemail with the updated date and times. Patient will be mailed an appointment reminder.

## 2023-07-16 ENCOUNTER — Ambulatory Visit: Payer: BC Managed Care – PPO | Admitting: Nurse Practitioner

## 2023-07-16 ENCOUNTER — Other Ambulatory Visit: Payer: BC Managed Care – PPO

## 2023-07-20 DIAGNOSIS — D485 Neoplasm of uncertain behavior of skin: Secondary | ICD-10-CM | POA: Diagnosis not present

## 2023-07-20 DIAGNOSIS — L814 Other melanin hyperpigmentation: Secondary | ICD-10-CM | POA: Diagnosis not present

## 2023-07-20 DIAGNOSIS — Z872 Personal history of diseases of the skin and subcutaneous tissue: Secondary | ICD-10-CM | POA: Diagnosis not present

## 2023-07-20 DIAGNOSIS — D225 Melanocytic nevi of trunk: Secondary | ICD-10-CM | POA: Diagnosis not present

## 2023-07-20 DIAGNOSIS — L821 Other seborrheic keratosis: Secondary | ICD-10-CM | POA: Diagnosis not present

## 2023-07-24 DIAGNOSIS — I10 Essential (primary) hypertension: Secondary | ICD-10-CM | POA: Diagnosis not present

## 2023-07-24 DIAGNOSIS — K219 Gastro-esophageal reflux disease without esophagitis: Secondary | ICD-10-CM | POA: Diagnosis not present

## 2023-07-24 DIAGNOSIS — Z7689 Persons encountering health services in other specified circumstances: Secondary | ICD-10-CM | POA: Diagnosis not present

## 2023-07-24 DIAGNOSIS — E7800 Pure hypercholesterolemia, unspecified: Secondary | ICD-10-CM | POA: Diagnosis not present

## 2023-08-06 ENCOUNTER — Telehealth: Payer: Self-pay | Admitting: Hematology

## 2023-08-06 ENCOUNTER — Other Ambulatory Visit: Payer: Self-pay

## 2023-08-06 MED ORDER — EXEMESTANE 25 MG PO TABS
25.0000 mg | ORAL_TABLET | Freq: Every day | ORAL | 0 refills | Status: DC
Start: 2023-08-06 — End: 2023-09-24

## 2023-08-10 DIAGNOSIS — E669 Obesity, unspecified: Secondary | ICD-10-CM | POA: Diagnosis not present

## 2023-08-10 DIAGNOSIS — I10 Essential (primary) hypertension: Secondary | ICD-10-CM | POA: Diagnosis not present

## 2023-08-10 DIAGNOSIS — K219 Gastro-esophageal reflux disease without esophagitis: Secondary | ICD-10-CM | POA: Diagnosis not present

## 2023-08-10 DIAGNOSIS — E7800 Pure hypercholesterolemia, unspecified: Secondary | ICD-10-CM | POA: Diagnosis not present

## 2023-09-07 ENCOUNTER — Ambulatory Visit: Payer: BC Managed Care – PPO

## 2023-09-17 IMAGING — MG MM PLC BREAST LOC DEV 1ST LESION INC*R*
8 of 9 series · 8 of 9 positions shown · non-contrast
Comparison: Previous exam(s).

CLINICAL DATA: 60-year-old with a biopsy-proven grade 2 invasive
ductal carcinoma and DCIS involving the upper RIGHT breast at the 12
o'clock location (ribbon clip) and flat epithelial atypia with
lobular neoplasia atypical lobular hyperplasia adjacent to the mass
(X clip). Radioactive seed localization was performed in
anticipation of lumpectomy which is scheduled for tomorrow.

EXAM:
MAMMOGRAPHIC GUIDED RADIOACTIVE SEED LOCALIZATION OF THE RIGHT
BREAST

[R ML (1 of 5)]
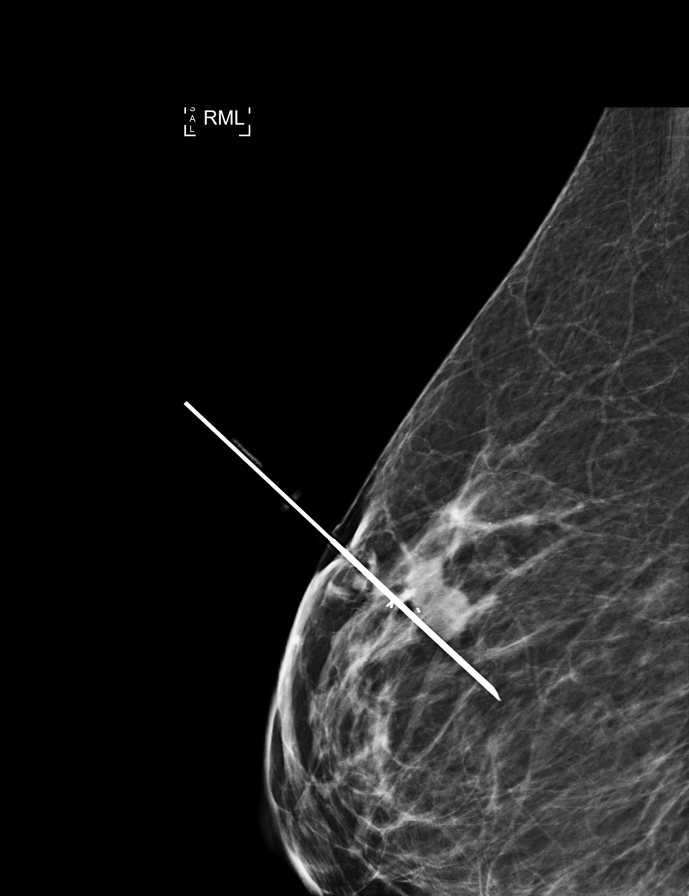

[R CC (1 of 3)]
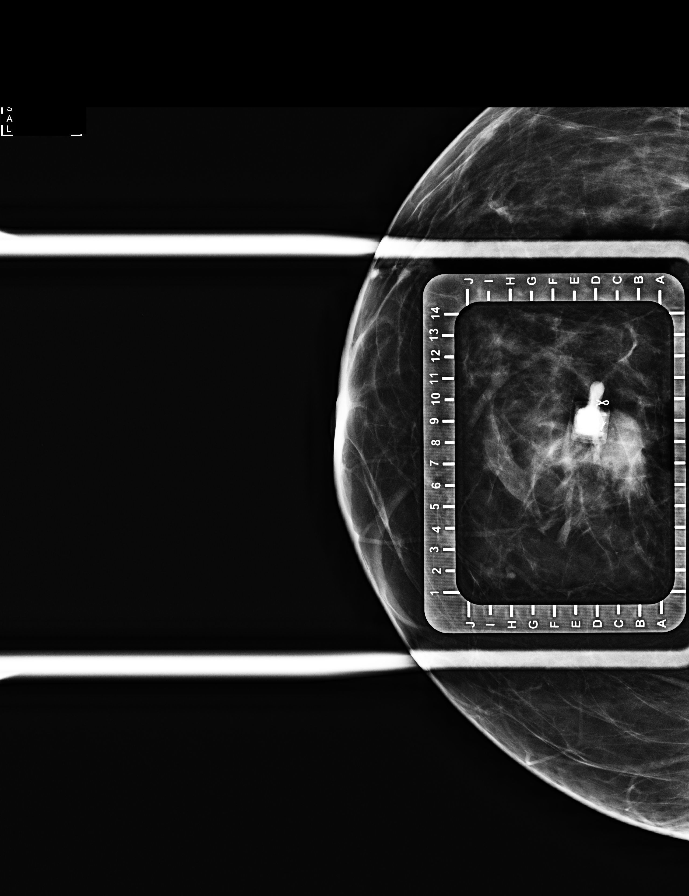

[R ML (2 of 5)]
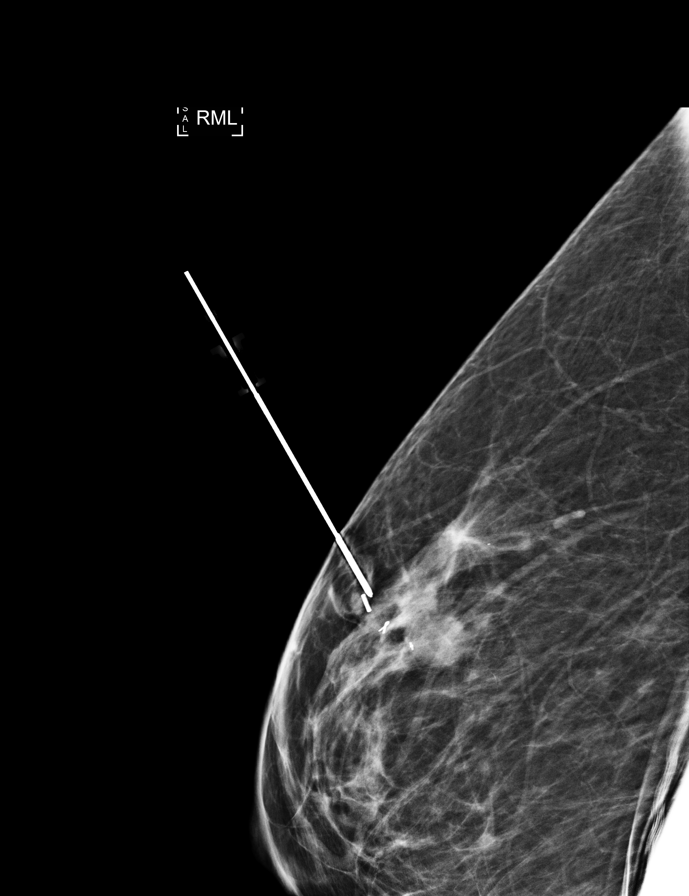

[R ML (3 of 5)]
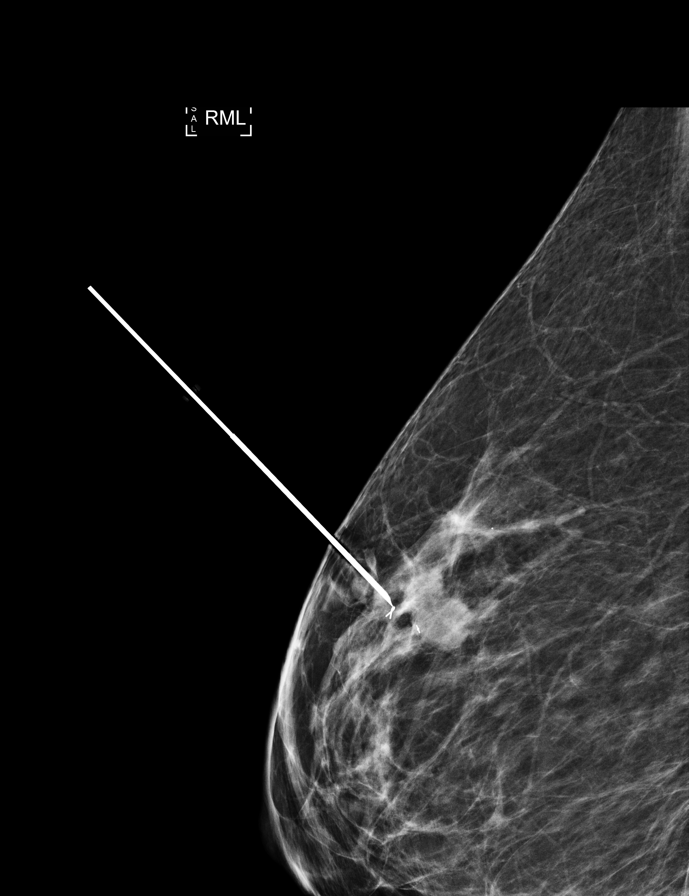

[R ML (4 of 5)]
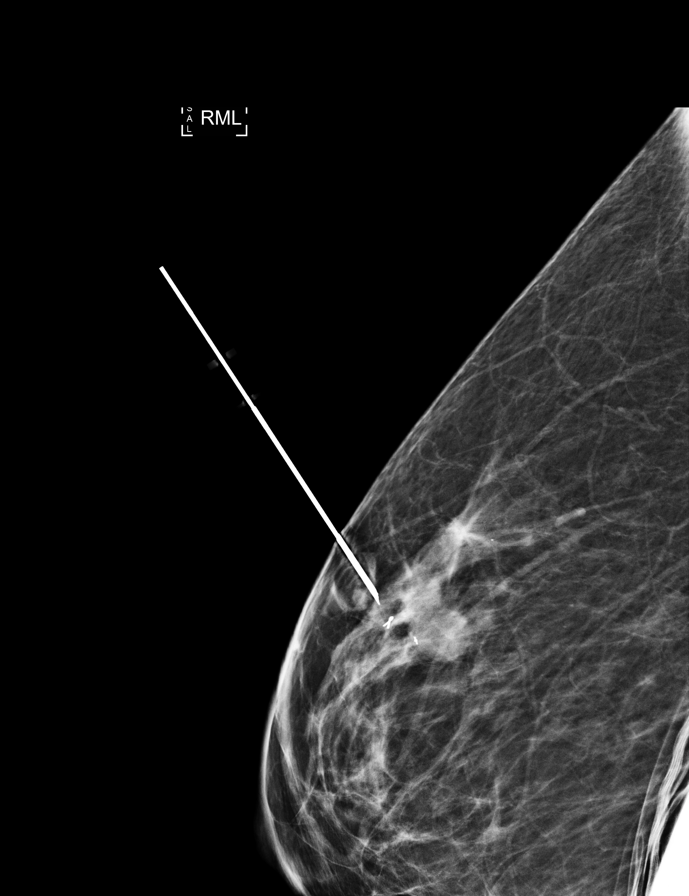

[R ML (5 of 5)]
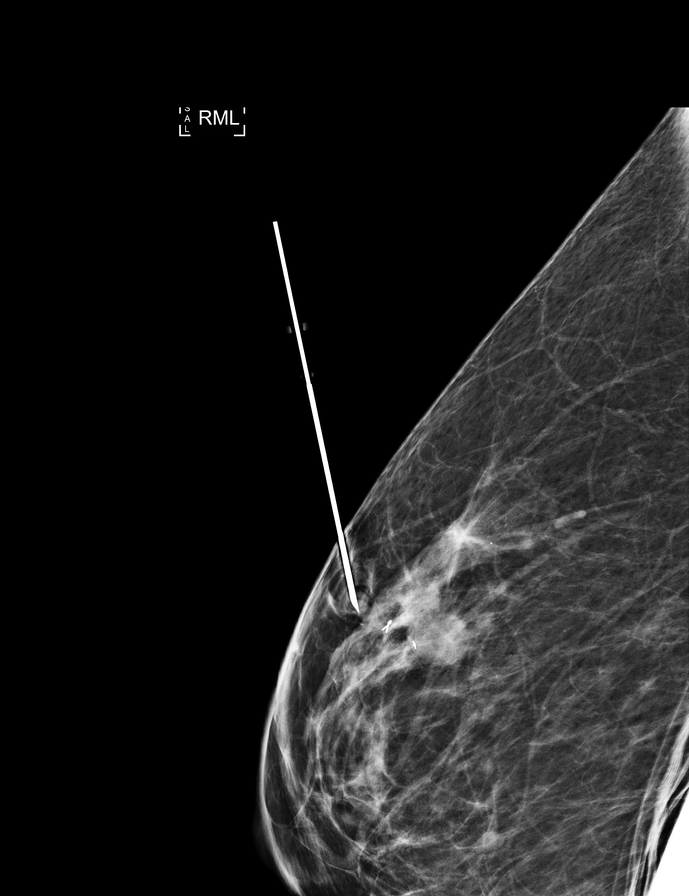

[R CC (2 of 3)]
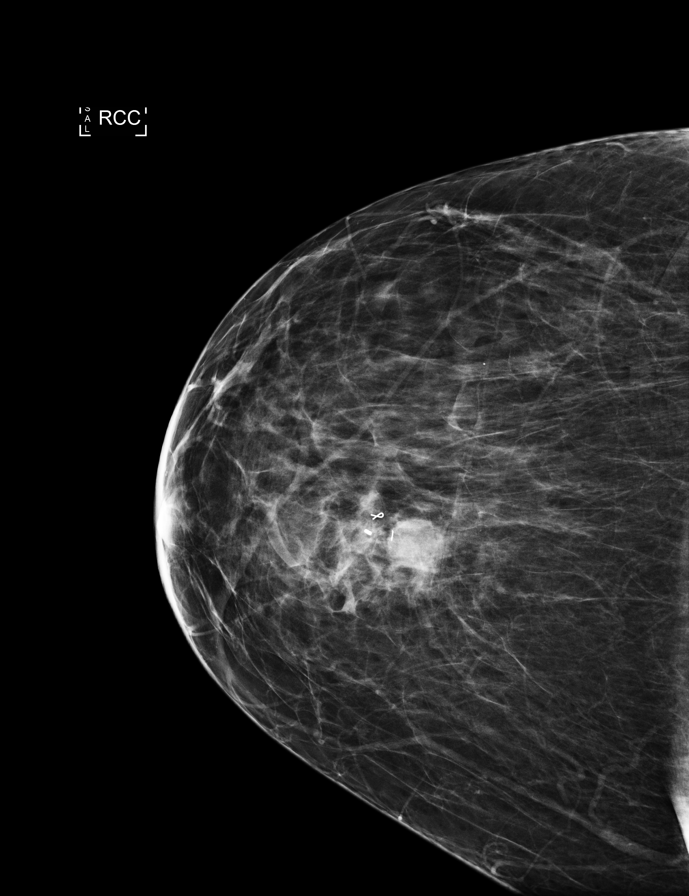

[R CC (3 of 3)]
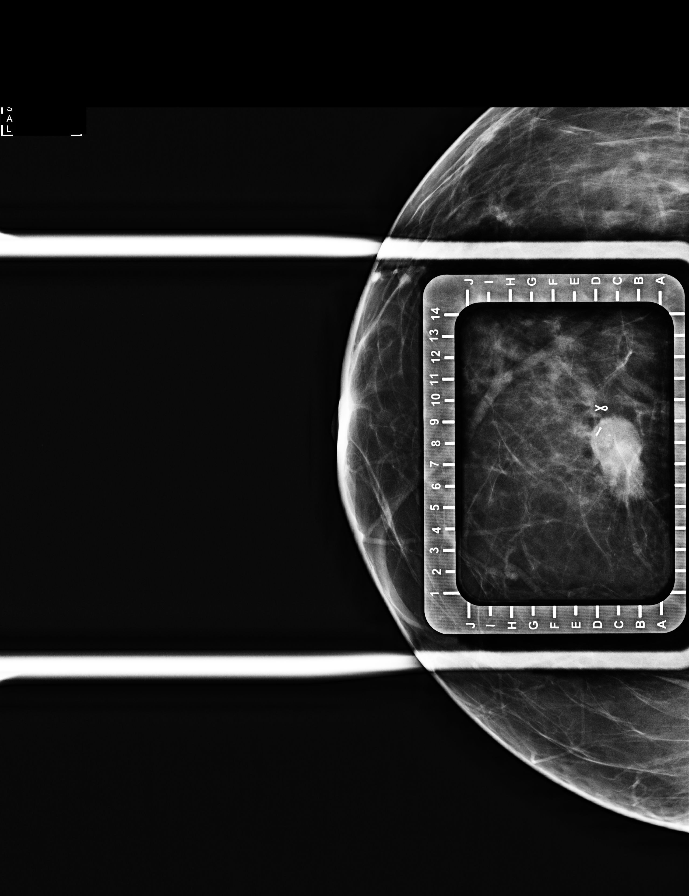

[8 of 9 positions shown; findings below may reference images not displayed]

FINDINGS: Patient presents for radioactive seed localization prior to RIGHT
breast lumpectomy. I met with the patient and we discussed the
procedure of seed localization including benefits and alternatives.
We discussed the high likelihood of a successful procedure. We
discussed the risks of the procedure including infection, bleeding,
tissue injury and further surgery. We discussed the low dose of
radioactivity involved in the procedure. Informed, written consent
was given.

The usual time-out protocol was performed immediately prior to the
procedure.

Using mammographic guidance, sterile technique with chlorhexidine
skin antisepsis, 1% lidocaine as local anesthetic, an I-K2R
radioactive seed was used to localize the biopsy-proven malignancy
and the HNILI/ALH in the upper RIGHT breast using a superior approach.

The follow-up mammogram images confirm that the seed is in the
expected location approximately 0.5 cm superior to the ribbon clip
(IDC/DCIS) and approximately 1.3 cm superior to the X clip
(HNILI/ALH). The images are marked for Dr. Jechu.

Follow-up survey of the patient confirms the presence of the
radioactive seed.

Order number of I-K2R seed: [id number]

Total activity: 0.259 mCi

Reference Date: 02/25/2021

The patient tolerated the procedure well and was released from the
[REDACTED]. She was given instructions regarding seed removal.
IMPRESSION: Radioactive seed localization of biopsy-proven malignancy involving
the upper RIGHT breast. No apparent complications.

## 2023-09-24 ENCOUNTER — Inpatient Hospital Stay: Attending: Hematology

## 2023-09-24 ENCOUNTER — Encounter: Payer: Self-pay | Admitting: Hematology

## 2023-09-24 ENCOUNTER — Inpatient Hospital Stay (HOSPITAL_BASED_OUTPATIENT_CLINIC_OR_DEPARTMENT_OTHER): Admitting: Hematology

## 2023-09-24 VITALS — BP 132/70 | HR 88 | Temp 98.8°F | Resp 15 | Wt 205.2 lb

## 2023-09-24 DIAGNOSIS — Z923 Personal history of irradiation: Secondary | ICD-10-CM | POA: Insufficient documentation

## 2023-09-24 DIAGNOSIS — Z1721 Progesterone receptor positive status: Secondary | ICD-10-CM | POA: Insufficient documentation

## 2023-09-24 DIAGNOSIS — R635 Abnormal weight gain: Secondary | ICD-10-CM | POA: Diagnosis not present

## 2023-09-24 DIAGNOSIS — M2560 Stiffness of unspecified joint, not elsewhere classified: Secondary | ICD-10-CM | POA: Diagnosis not present

## 2023-09-24 DIAGNOSIS — C50411 Malignant neoplasm of upper-outer quadrant of right female breast: Secondary | ICD-10-CM | POA: Diagnosis not present

## 2023-09-24 DIAGNOSIS — Z79811 Long term (current) use of aromatase inhibitors: Secondary | ICD-10-CM | POA: Diagnosis not present

## 2023-09-24 DIAGNOSIS — Z1732 Human epidermal growth factor receptor 2 negative status: Secondary | ICD-10-CM | POA: Insufficient documentation

## 2023-09-24 DIAGNOSIS — Z170 Estrogen receptor positive status [ER+]: Secondary | ICD-10-CM

## 2023-09-24 DIAGNOSIS — E7800 Pure hypercholesterolemia, unspecified: Secondary | ICD-10-CM | POA: Insufficient documentation

## 2023-09-24 DIAGNOSIS — R232 Flushing: Secondary | ICD-10-CM | POA: Diagnosis not present

## 2023-09-24 LAB — CBC WITH DIFFERENTIAL (CANCER CENTER ONLY)
Abs Immature Granulocytes: 0.01 10*3/uL (ref 0.00–0.07)
Basophils Absolute: 0.1 10*3/uL (ref 0.0–0.1)
Basophils Relative: 1 %
Eosinophils Absolute: 0.2 10*3/uL (ref 0.0–0.5)
Eosinophils Relative: 4 %
HCT: 43.5 % (ref 36.0–46.0)
Hemoglobin: 14.1 g/dL (ref 12.0–15.0)
Immature Granulocytes: 0 %
Lymphocytes Relative: 28 %
Lymphs Abs: 1.7 10*3/uL (ref 0.7–4.0)
MCH: 29.5 pg (ref 26.0–34.0)
MCHC: 32.4 g/dL (ref 30.0–36.0)
MCV: 91 fL (ref 80.0–100.0)
Monocytes Absolute: 0.7 10*3/uL (ref 0.1–1.0)
Monocytes Relative: 11 %
Neutro Abs: 3.4 10*3/uL (ref 1.7–7.7)
Neutrophils Relative %: 56 %
Platelet Count: 338 10*3/uL (ref 150–400)
RBC: 4.78 MIL/uL (ref 3.87–5.11)
RDW: 13.5 % (ref 11.5–15.5)
WBC Count: 6.1 10*3/uL (ref 4.0–10.5)
nRBC: 0 % (ref 0.0–0.2)

## 2023-09-24 LAB — CMP (CANCER CENTER ONLY)
ALT: 41 U/L (ref 0–44)
AST: 47 U/L — ABNORMAL HIGH (ref 15–41)
Albumin: 4.6 g/dL (ref 3.5–5.0)
Alkaline Phosphatase: 121 U/L (ref 38–126)
Anion gap: 7 (ref 5–15)
BUN: 18 mg/dL (ref 8–23)
CO2: 29 mmol/L (ref 22–32)
Calcium: 9.9 mg/dL (ref 8.9–10.3)
Chloride: 103 mmol/L (ref 98–111)
Creatinine: 0.82 mg/dL (ref 0.44–1.00)
GFR, Estimated: >60 mL/min (ref >60)
Glucose, Bld: 120 mg/dL — ABNORMAL HIGH (ref 70–99)
Potassium: 4.5 mmol/L (ref 3.5–5.1)
Sodium: 139 mmol/L (ref 135–145)
Total Bilirubin: 0.9 mg/dL (ref 0.0–1.2)
Total Protein: 7.7 g/dL (ref 6.5–8.1)

## 2023-09-24 MED ORDER — EXEMESTANE 25 MG PO TABS: 25.0000 mg | ORAL_TABLET | Freq: Every day | ORAL | 3 refills | Status: AC

## 2023-09-24 NOTE — Assessment & Plan Note (Signed)
 IDC and DCIS, Stage IA, p(T1b, N0), ER+/PR+/HER2-, Grade 2  -Diagnosed 02/2021, S/p right lumpectomy on 04/04/21, path showed IDC and DCIS, as well as ALH. -Oncotype RS of 14, low risk. -s/p radiation therapy under Dr. Eloise Hake 1/26-07/01/21. -she started exemestane  in early 07/2021. She is tolerating well with manageable hot flashes and joint stiffness.

## 2023-09-24 NOTE — Progress Notes (Signed)
 [facility]   [phone] [phone]   Clinic Follow up Note   Patient Care Team: [name], MD as PCP - General (Internal Medicine) [name], MD as Consulting Physician (General Surgery) [name], MD as Consulting Physician (Hematology) [name], MD as Consulting Physician (Radiation Oncology)  Date of Service:  09/24/2023  CHIEF COMPLAINT: f/u of right breast cancer  CURRENT THERAPY:  Adjuvant exemestane   Oncology History   Malignant neoplasm of upper-outer quadrant of right breast in female, estrogen receptor positive (HCC)  IDC and DCIS, Stage IA, p(T1b, N0), ER+/PR+/HER2-, Grade 2  -Diagnosed 02/2021, S/p right lumpectomy on 04/04/21, path showed IDC and DCIS, as well as ALH. -Oncotype RS of 14, low risk. -s/p radiation therapy under Dr. Eloise Hake 1/26-07/01/21. -she started exemestane  in early 07/2021. She is tolerating well with manageable hot flashes and joint stiffness.   Assessment & Plan Breast cancer, stage 1 Stage 1 breast cancer, initially diagnosed in October 2022, with no evidence of recurrence. She prefers follow-up and mammograms at the current facility due to trust and immediate results. Diagnostic mammogram covered by insurance until November 2025; post-November, transition to screening mammograms may be necessary if Medicare does not cover diagnostic mammograms. On Exemestane , to continue for five years, ending April 2028. Oncotype low risk, indicating favorable prognosis. - Schedule diagnostic mammogram in October 2025 t [facility]. - Continue Exemestane  until April 2028. Refilled today  - Set up virtual follow-up in six months. - Consider screening mammogram post-November 2025 if Medicare does not cover diagnostic mammogram.  Weight gain  Weight gain of approximately 20 pounds, potentially due to Exemestane 's effect on metabolism. Reports no significant dietary changes but reduced physical activity. Encouraged to monitor  diet, reduce carbohydrates, and increase physical activity. - Encourage weight-bearing exercise and stretching. - Advise dietary modifications to reduce carbohydrates and high-calorie foods.  Hypercholesterolemia Cholesterol levels elevated with high HDL. Previous calcium deposit score very low, indicating low cardiovascular risk. Managing with diet and exercise. - Continue current dietary and exercise regimen. - Consider repeating calcium deposit score every three years.  Plan - She is clinically doing well, no concern for recurrence - Continue exemestane , I refilled today  - Due to her distance from our we will do virtual visit in 6 months - I ordered diagnostic to be done at breast Center October 2025     SUMMARY OF ONCOLOGIC HISTORY: Oncology History Overview Note   Cancer Staging  Malignant neoplasm of upper-outer quadrant of right breast in female, estrogen receptor positive (HCC) Staging form: Breast, AJCC 8th Edition - Clinical stage from 02/08/2021: Stage IA (cT1b, cN0, cM0, G2, ER+, PR+, HER2-) - Signed by Sonja [place], MD on 02/19/2021 - Pathologic stage from 04/04/2021: Stage IA (pT1c, pN0, cM0, G1, ER+, PR+, HER2-, Oncotype DX score: 14) - Signed by Sonja [place], MD on 07/08/2021     Malignant neoplasm of upper-outer quadrant of right breast in female, estrogen receptor positive (HCC)  02/02/2021 Mammogram   Right Diagnostic MM and Right Breast US   IMPRESSION: 1.  Suspicious 0.8 cm mass in the right breast at the 12 position. 2. Suspicious calcifications adjacent to the right breast mass spanning 1.1 cm.  No lymphadenopathy seen in right axilla.   02/08/2021 Cancer Staging   Staging form: Breast, AJCC 8th Edition - Clinical stage from 02/08/2021: Stage IA (cT1b, cN0, cM0, G2, ER+, PR+, HER2-) - Signed by Sonja [place], MD on 02/19/2021 Stage prefix: Initial diagnosis Histologic grading system: 3 grade system  02/08/2021 Pathology Results   Diagnosis 1. Breast, right,  needle core biopsy, 12 o'clock, ribbon shaped clip - INVASIVE DUCTAL CARCINOMA - DUCTAL CARCINOMA IN SITU - CALCIFICATIONS - SEE COMMENT 2. Breast, right, needle core biopsy, upper inner quadrant, x shaped clip - FLAT EPITHELIAL ATYPIA WITH CALCIFICATIONS - LOBULAR NEOPLASIA (ATYPICAL LOBULAR HYPERPLASIA) Microscopic Comment 1. Based on the biopsy, the carcinoma appears Nottingham grade 1-2 of 3 and measures 0.6 cm in greatest linear extent.  1. PROGNOSTIC INDICATORS Results: The tumor cells are EQUIVOCAL for Her2 (2+). Her2 by FISH will be performed and results reported separately. Estrogen Receptor: >95%, POSITIVE, STRONG STAINING INTENSITY Progesterone Receptor: >95%, POSITIVE, STRONG STAINING INTENSITY Proliferation Marker Ki67: 1%  1. FLUORESCENCE IN-SITU HYBRIDIZATION Results: GROUP 5: HER2 **NEGATIVE**   02/15/2021 Initial Diagnosis   Malignant neoplasm of upper-outer quadrant of right breast in female, estrogen receptor positive (HCC)   03/04/2021 Genetic Testing   Negative hereditary cancer genetic testing: no pathogenic variants detected in [employer] CustomNext-Cancer +RNAinsight Panel.  The report date is March 04, 2021.   The CustomNext-Cancer+RNAinsight panel offered by [employer] includes sequencing and rearrangement analysis for the following 47 genes:  APC, ATM, AXIN2, BARD1, BMPR1A, BRCA1, BRCA2, BRIP1, CDH1, CDK4, CDKN2A, CHEK2, DICER1, EPCAM, GREM1, HOXB13, MEN1, MLH1, MSH2, MSH3, MSH6, MUTYH, NBN, NF1, NF2, NTHL1, PALB2, PMS2, POLD1, POLE, PTEN, RAD51C, RAD51D, RECQL, RET, SDHA, SDHAF2, SDHB, SDHC, SDHD, SMAD4, SMARCA4, STK11, TP53, TSC1, TSC2, and VHL.  RNA data is routinely analyzed for use in variant interpretation for all genes.   04/04/2021 Cancer Staging   Staging form: Breast, AJCC 8th Edition - Pathologic stage from 04/04/2021: Stage IA (pT1c, pN0, cM0, G1, ER+, PR+, HER2-, Oncotype DX score: 14) - Signed by Sonja , MD on 07/08/2021 Stage prefix: Initial  diagnosis Multigene prognostic tests performed: Oncotype DX Recurrence score range: Greater than or equal to 11 Histologic grading system: 3 grade system   04/04/2021 Definitive Surgery   FINAL MICROSCOPIC DIAGNOSIS:   A. BREAST, RIGHT, LUMPECTOMY:  - Invasive ductal carcinoma, 1.1 cm, grade 1  - Ductal carcinoma in situ, low-grade  - Resection margins are negative for carcinoma - closest is the anterior  margin at 0.8 cm  - Atypical lobular hyperplasia  - Biopsy site changes  - See oncology table   B. LYMPH NODE, RIGHT AXILLA, SENTINEL, EXCISION:  - Lymph node, negative for carcinoma (0/1)   C. LYMPH NODE, RIGHT AXILLA, SENTINEL, EXCISION:  - Lymph node, negative for carcinoma (0/1)   D. LYMPH NODE, RIGHT AXILLA, SENTINEL, EXCISION:  - Lymph node, negative for carcinoma (0/1)   E. BREAST, RIGHT POSTERIOR MARGIN, EXCISION:  - Atypical lobular hyperplasia    04/04/2021 Oncotype testing   Oncotype DX was obtained on the final surgical sample and the recurrence score of 14 predicts a risk of recurrence outside the breast over the next 9 years of 4%, if the patient's only systemic therapy is an antiestrogen for 5 years.  It also predicts no benefit from chemotherapy.    04/15/2021 Pathology Results   FINAL MICROSCOPIC DIAGNOSIS:   A. BREAST, RIGHT, MAMMOPLASTY:  - Benign mammary parenchyma with fat necrosis and giant cell reaction, 121 grams.  - Negative for atypical proliferative breast disease.   B. BREAST, LEFT, MAMMOPLASTY:  - Benign mammary parenchyma, 175 grams.  - Negative for atypical proliferative breast disease.   05/23/2021 - 07/01/2021 Radiation Therapy   Site Technique Total Dose (Gy) Dose per Fx (Gy) Completed Fx Beam Energies  Breast, Right: Breast_R  3D 50.4/50.4 1.8 28/28 6XFFF, 10XFFF     07/2021 -  Anti-estrogen oral therapy   Exemestane  daily      Discussed the use of AI scribe software for clinical note transcription with the patient, who gave verbal  consent to proceed.  History of Present Illness Donna Mcconnell "Catherin Closs" is a 63 year old female with breast cancer who presents for follow-up.  Diagnosed with stage 1 breast cancer in October 2022, she underwent breast reduction surgery and radiation therapy, resulting in asymmetry and tenderness in the right breast. She is on Exemestane , which she finds affordable, and has transferred her prescription to a local pharmacy in [place] . She attributes a 20-pound weight gain to reduced physical activity and possibly Exemestane . She exercises regularly, including playing pickleball.  A bone density test in January showed improvement with a T-score of -0.3 compared to -0.7 in November 2022. She has stopped drinking Diet Coke to protect her bone health. Her past medical history includes high cholesterol managed with diet and previously high ferritin levels. She has moved to [place]  with her husband to be near family.     All other systems were reviewed with the patient and are negative.  MEDICAL HISTORY:  Past Medical History:  Diagnosis Date   Allergy    shellfish   Ameloblastoma of jaw    s/p left jaw tumor resection April 2022 with bone graft July 2022 (Dr. Altha Athens)   Breast cancer [facility])    Complication of anesthesia    Coronary atherosclerosis    COVID 03/2019   fever, bodyaches   Dysplastic nevus 10/09/2022   left upper back - severe  excised 12/17/2022   Fatty liver disease, nonalcoholic    Food allergy    GERD (gastroesophageal reflux disease)    History of radiation therapy    right breast - 05/23/2021-07/01/2021  Dr [name]   Hyperlipidemia    Hypertension    PONV (postoperative nausea and vomiting)    Spondylosis     SURGICAL HISTORY: Past Surgical History:  Procedure Laterality Date   ADENOIDECTOMY     BREAST LUMPECTOMY WITH RADIOACTIVE SEED AND SENTINEL LYMPH NODE BIOPSY Right 04/04/2021   Procedure: RIGHT BREAST LUMPECTOMY WITH RADIOACTIVE SEED AND AXILLARY  SENTINEL LYMPH NODE BIOPSY;  Surgeon: [name], MD;  Location: [facility] OR;  Service: General;  Laterality: Right;   BREAST RECONSTRUCTION Right 04/15/2021   Procedure: RIGHT ONCOPLASTIC BREAST RECONSTRUCTION;  Surgeon: Alger Infield, MD;  Location: [facility] OR;  Service: Plastics;  Laterality: Right;   BREAST REDUCTION SURGERY Left 04/15/2021   Procedure: LEFT BREAST REDUCTION  (BREAST);  Surgeon: Alger Infield, MD;  Location: [facility] OR;  Service: Plastics;  Laterality: Left;   cercalage     COLONOSCOPY     10 yrs ago   MOUTH SURGERY  03/28/2021   TONSILLECTOMY     TYMPANOSTOMY TUBE PLACEMENT      I have reviewed the social history and family history with the patient and they are unchanged from previous note.  ALLERGIES:  is allergic to shellfish allergy.  MEDICATIONS:  Current Outpatient Medications  Medication Sig Dispense Refill   CALCIUM-VITAMIN D PO Take 1 tablet by mouth daily.     EPINEPHrine  (EPIPEN  2-PAK) 0.3 mg/0.3 mL IJ SOAJ injection Inject 0.3 mg into the muscle as needed for anaphylaxis. 1 each 1   exemestane  (AROMASIN ) 25 MG tablet Take 1 tablet (25 mg total) by mouth daily after breakfast. 90 tablet 3   mupirocin  ointment (BACTROBAN ) 2 %  Apply 1 Application topically 2 (two) times daily. Apply to wound and cover with a bandaid 22 g 0   pantoprazole (PROTONIX) 40 MG tablet Take 40 mg by mouth daily.     Ruxolitinib Phosphate  (OPZELURA ) 1.5 % CREA Apply 1 Application topically 2 (two) times daily. Twice daily around eyelids when needed 60 g 0   valsartan (DIOVAN) 80 MG tablet Take 80 mg by mouth daily.     No current facility-administered medications for this visit.    PHYSICAL EXAMINATION: ECOG PERFORMANCE STATUS: 0 - Asymptomatic  Vitals:   09/24/23 0953  BP: 132/70  Pulse: 88  Resp: 15  Temp: 98.8 F (37.1 C)  SpO2: 97%   Wt Readings from Last 3 Encounters:  09/24/23 205 lb 3.2 oz (93.1 kg)  03/09/23 203 lb 4 oz (92.2 kg)  01/15/23 205 lb (93 kg)      GENERAL:alert, no distress and comfortable SKIN: skin color, texture, turgor are normal, no rashes or significant lesions EYES: normal, Conjunctiva are pink and non-injected, sclera clear NECK: supple, thyroid  normal size, non-tender, without nodularity LYMPH:  no palpable lymphadenopathy in the cervical, axillary  LUNGS: clear to auscultation and percussion with normal breathing effort HEART: regular rate & rhythm and no murmurs and no lower extremity edema ABDOMEN:abdomen soft, non-tender and normal bowel sounds Musculoskeletal:no cyanosis of digits and no clubbing  NEURO: alert & oriented x 3 with fluent speech, no focal motor/sensory deficits  Physical Exam CHEST: Lungs clear to auscultation. BREAST: Breasts normal on exam. ABDOMEN: Abdomen non-tender.  LABORATORY DATA:  I have reviewed the data as listed    Latest Ref Rng & Units 09/24/2023    9:34 AM 01/15/2023    9:32 AM 07/14/2022    9:35 AM  CBC  WBC 4.0 - 10.5 K/uL 6.1  4.8  4.5   Hemoglobin 12.0 - 15.0 g/dL 40.9  81.1  91.4   Hematocrit 36.0 - 46.0 % 43.5  41.8  40.8   Platelets 150 - 400 K/uL 338  318  312         Latest Ref Rng & Units 09/24/2023    9:34 AM 01/15/2023    9:32 AM 07/14/2022    9:35 AM  CMP  Glucose 70 - 99 mg/dL 782  93  956   BUN 8 - 23 mg/dL 18  21  20    Creatinine 0.44 - 1.00 mg/dL 2.13  0.86  5.78   Sodium 135 - 145 mmol/L 139  139  140   Potassium 3.5 - 5.1 mmol/L 4.5  5.0  3.9   Chloride 98 - 111 mmol/L 103  104  106   CO2 22 - 32 mmol/L 29  29  27    Calcium 8.9 - 10.3 mg/dL 9.9  46.9  9.6   Total Protein 6.5 - 8.1 g/dL 7.7  7.3  7.1   Total Bilirubin 0.0 - 1.2 mg/dL 0.9  0.9  0.8   Alkaline Phos 38 - 126 U/L 121  131  121   AST 15 - 41 U/L 47  28  23   ALT 0 - 44 U/L 41  19  15       RADIOGRAPHIC STUDIES: I have personally reviewed the radiological images as listed and agreed with the findings in the report. No results found.    No orders of the defined types were placed in  this encounter.  All questions were answered. The patient knows to call the clinic with any problems, questions  or concerns. No barriers to learning was detected. The total time spent in the appointment was 30 minutes, including review of chart and various tests results, discussions about plan of care and coordination of care plan     Sonja [place], MD 09/24/2023

## 2024-01-07 ENCOUNTER — Other Ambulatory Visit: Payer: Self-pay | Admitting: Hematology

## 2024-01-07 DIAGNOSIS — Z853 Personal history of malignant neoplasm of breast: Secondary | ICD-10-CM

## 2024-02-02 DIAGNOSIS — I10 Essential (primary) hypertension: Secondary | ICD-10-CM | POA: Diagnosis not present

## 2024-02-02 DIAGNOSIS — E7800 Pure hypercholesterolemia, unspecified: Secondary | ICD-10-CM | POA: Diagnosis not present

## 2024-02-17 DIAGNOSIS — C50919 Malignant neoplasm of unspecified site of unspecified female breast: Secondary | ICD-10-CM | POA: Diagnosis not present

## 2024-02-17 DIAGNOSIS — E669 Obesity, unspecified: Secondary | ICD-10-CM | POA: Diagnosis not present

## 2024-02-17 DIAGNOSIS — K219 Gastro-esophageal reflux disease without esophagitis: Secondary | ICD-10-CM | POA: Diagnosis not present

## 2024-02-17 DIAGNOSIS — Z0000 Encounter for general adult medical examination without abnormal findings: Secondary | ICD-10-CM | POA: Diagnosis not present

## 2024-02-18 DIAGNOSIS — L570 Actinic keratosis: Secondary | ICD-10-CM | POA: Diagnosis not present

## 2024-02-18 DIAGNOSIS — Z872 Personal history of diseases of the skin and subcutaneous tissue: Secondary | ICD-10-CM | POA: Diagnosis not present

## 2024-02-18 DIAGNOSIS — L700 Acne vulgaris: Secondary | ICD-10-CM | POA: Diagnosis not present

## 2024-02-18 DIAGNOSIS — L720 Epidermal cyst: Secondary | ICD-10-CM | POA: Diagnosis not present

## 2024-02-22 ENCOUNTER — Other Ambulatory Visit: Payer: Self-pay | Admitting: Hematology

## 2024-02-22 ENCOUNTER — Ambulatory Visit
Admission: RE | Admit: 2024-02-22 | Discharge: 2024-02-22 | Disposition: A | Discharge status: Home / Self Care | Source: Ambulatory Visit | Attending: Hematology | Admitting: Hematology

## 2024-02-22 DIAGNOSIS — R921 Mammographic calcification found on diagnostic imaging of breast: Secondary | ICD-10-CM | POA: Diagnosis not present

## 2024-02-22 DIAGNOSIS — D165 Benign neoplasm of lower jaw bone: Secondary | ICD-10-CM | POA: Diagnosis not present

## 2024-02-22 DIAGNOSIS — Z853 Personal history of malignant neoplasm of breast: Secondary | ICD-10-CM

## 2024-02-22 DIAGNOSIS — Z09 Encounter for follow-up examination after completed treatment for conditions other than malignant neoplasm: Secondary | ICD-10-CM | POA: Diagnosis not present

## 2024-02-22 DIAGNOSIS — Z86018 Personal history of other benign neoplasm: Secondary | ICD-10-CM | POA: Diagnosis not present

## 2024-02-24 ENCOUNTER — Ambulatory Visit
Admission: RE | Admit: 2024-02-24 | Discharge: 2024-02-24 | Disposition: A | Discharge status: Home / Self Care | Source: Ambulatory Visit | Attending: Hematology | Admitting: Hematology

## 2024-02-24 DIAGNOSIS — R921 Mammographic calcification found on diagnostic imaging of breast: Secondary | ICD-10-CM | POA: Diagnosis not present

## 2024-02-24 DIAGNOSIS — Z853 Personal history of malignant neoplasm of breast: Secondary | ICD-10-CM

## 2024-02-25 LAB — SURGICAL PATHOLOGY

## 2024-03-28 ENCOUNTER — Telehealth: Admitting: Nurse Practitioner
